# Patient Record
Sex: Female | Born: 1990 | Race: Black or African American | Hispanic: No | Marital: Single | Smoking: Never smoker
Health system: Southern US, Community
[De-identification: ages and names within clinical notes are randomized; demographics above are authoritative.]

## PROBLEM LIST (undated history)

## (undated) ENCOUNTER — Inpatient Hospital Stay (HOSPITAL_COMMUNITY): Payer: Self-pay

## (undated) DIAGNOSIS — D649 Anemia, unspecified: Secondary | ICD-10-CM

## (undated) DIAGNOSIS — K602 Anal fissure, unspecified: Secondary | ICD-10-CM

## (undated) DIAGNOSIS — F53 Postpartum depression: Secondary | ICD-10-CM

## (undated) DIAGNOSIS — N72 Inflammatory disease of cervix uteri: Secondary | ICD-10-CM

## (undated) DIAGNOSIS — L309 Dermatitis, unspecified: Secondary | ICD-10-CM

## (undated) DIAGNOSIS — Z86718 Personal history of other venous thrombosis and embolism: Secondary | ICD-10-CM

## (undated) DIAGNOSIS — Z87440 Personal history of urinary (tract) infections: Secondary | ICD-10-CM

## (undated) DIAGNOSIS — IMO0001 Reserved for inherently not codable concepts without codable children: Secondary | ICD-10-CM

## (undated) DIAGNOSIS — Z5189 Encounter for other specified aftercare: Secondary | ICD-10-CM

## (undated) DIAGNOSIS — O99345 Other mental disorders complicating the puerperium: Secondary | ICD-10-CM

## (undated) HISTORY — DX: Anal fissure, unspecified: K60.2

## (undated) HISTORY — DX: Personal history of urinary (tract) infections: Z87.440

## (undated) HISTORY — DX: Personal history of other venous thrombosis and embolism: Z86.718

## (undated) HISTORY — DX: Inflammatory disease of cervix uteri: N72

## (undated) HISTORY — PX: WISDOM TOOTH EXTRACTION: SHX21

## (undated) HISTORY — DX: Postpartum depression: F53.0

## (undated) HISTORY — DX: Other mental disorders complicating the puerperium: O99.345

---
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
## 2011-01-01 ENCOUNTER — Emergency Department (HOSPITAL_COMMUNITY)
Admission: EM | Admit: 2011-01-01 | Discharge: 2011-01-01 | Disposition: A | Discharge status: Home / Self Care | Payer: Medicaid Other | Attending: Emergency Medicine | Admitting: Emergency Medicine

## 2011-01-01 DIAGNOSIS — N6489 Other specified disorders of breast: Secondary | ICD-10-CM | POA: Insufficient documentation

## 2011-01-01 DIAGNOSIS — L299 Pruritus, unspecified: Secondary | ICD-10-CM | POA: Insufficient documentation

## 2011-01-31 LAB — CBC
HCT: 22.3 % — ABNORMAL LOW (ref 36.0–46.0)
Hemoglobin: 7.3 g/dL — ABNORMAL LOW (ref 12.0–15.0)
Hemoglobin: 7.7 g/dL — ABNORMAL LOW (ref 12.0–15.0)
MCH: 31 pg (ref 26.0–34.0)
MCH: 31.5 pg (ref 26.0–34.0)
MCHC: 34.5 g/dL (ref 30.0–36.0)
MCV: 89.8 fL (ref 78.0–100.0)
MCV: 90.7 fL (ref 78.0–100.0)
MCV: 91.2 fL (ref 78.0–100.0)
Platelets: 145 10*3/uL — ABNORMAL LOW (ref 150–400)
RBC: 2.36 MIL/uL — ABNORMAL LOW (ref 3.87–5.11)
RDW: 13.6 % (ref 11.5–15.5)
WBC: 11.4 10*3/uL — ABNORMAL HIGH (ref 4.0–10.5)
WBC: 7.9 10*3/uL (ref 4.0–10.5)

## 2011-01-31 LAB — CROSSMATCH
ABO/RH(D): A POS
Antibody Screen: NEGATIVE
Unit division: 0

## 2011-01-31 LAB — PREPARE FRESH FROZEN PLASMA: Unit division: 0

## 2011-01-31 LAB — FIBRINOGEN: Fibrinogen: 328 mg/dL (ref 204–475)

## 2011-01-31 LAB — MRSA PCR SCREENING: MRSA by PCR: NEGATIVE

## 2011-07-26 ENCOUNTER — Emergency Department (HOSPITAL_COMMUNITY)
Admission: EM | Admit: 2011-07-26 | Discharge: 2011-07-27 | Disposition: A | Discharge status: Home / Self Care | Payer: Medicaid Other | Attending: Emergency Medicine | Admitting: Emergency Medicine

## 2011-07-26 DIAGNOSIS — N898 Other specified noninflammatory disorders of vagina: Secondary | ICD-10-CM | POA: Insufficient documentation

## 2011-07-26 DIAGNOSIS — M545 Low back pain, unspecified: Secondary | ICD-10-CM | POA: Insufficient documentation

## 2011-07-26 DIAGNOSIS — R112 Nausea with vomiting, unspecified: Secondary | ICD-10-CM | POA: Insufficient documentation

## 2011-07-26 DIAGNOSIS — IMO0001 Reserved for inherently not codable concepts without codable children: Secondary | ICD-10-CM | POA: Insufficient documentation

## 2011-07-26 DIAGNOSIS — B9789 Other viral agents as the cause of diseases classified elsewhere: Secondary | ICD-10-CM | POA: Insufficient documentation

## 2011-07-26 DIAGNOSIS — R109 Unspecified abdominal pain: Secondary | ICD-10-CM | POA: Insufficient documentation

## 2011-07-26 DIAGNOSIS — R509 Fever, unspecified: Secondary | ICD-10-CM | POA: Insufficient documentation

## 2011-07-26 LAB — URINALYSIS, ROUTINE W REFLEX MICROSCOPIC
Bilirubin Urine: NEGATIVE
Glucose, UA: NEGATIVE mg/dL
Hgb urine dipstick: NEGATIVE
Specific Gravity, Urine: 1.02 (ref 1.005–1.030)
Urobilinogen, UA: 2 mg/dL — ABNORMAL HIGH (ref 0.0–1.0)

## 2011-07-27 LAB — BASIC METABOLIC PANEL
BUN: 8 mg/dL (ref 6–23)
CO2: 25 mEq/L (ref 19–32)
Calcium: 8.6 mg/dL (ref 8.4–10.5)
Chloride: 104 mEq/L (ref 96–112)
Creatinine, Ser: 0.67 mg/dL (ref 0.50–1.10)
Glucose, Bld: 86 mg/dL (ref 70–99)

## 2011-07-27 LAB — DIFFERENTIAL
Eosinophils Absolute: 0 10*3/uL (ref 0.0–0.7)
Lymphocytes Relative: 9 % — ABNORMAL LOW (ref 12–46)
Lymphs Abs: 1 10*3/uL (ref 0.7–4.0)
Monocytes Relative: 8 % (ref 3–12)
Neutro Abs: 8.9 10*3/uL — ABNORMAL HIGH (ref 1.7–7.7)
Neutrophils Relative %: 83 % — ABNORMAL HIGH (ref 43–77)

## 2011-07-27 LAB — CBC
HCT: 34.5 % — ABNORMAL LOW (ref 36.0–46.0)
Hemoglobin: 12.3 g/dL (ref 12.0–15.0)
MCH: 30.6 pg (ref 26.0–34.0)
MCV: 85.8 fL (ref 78.0–100.0)
Platelets: 187 10*3/uL (ref 150–400)
RBC: 4.02 MIL/uL (ref 3.87–5.11)
WBC: 10.6 10*3/uL — ABNORMAL HIGH (ref 4.0–10.5)

## 2011-07-27 LAB — WET PREP, GENITAL: Clue Cells Wet Prep HPF POC: NONE SEEN

## 2011-07-27 LAB — RAPID STREP SCREEN (MED CTR MEBANE ONLY): Streptococcus, Group A Screen (Direct): NEGATIVE

## 2011-07-27 LAB — POCT PREGNANCY, URINE: Preg Test, Ur: NEGATIVE

## 2011-07-27 LAB — GC/CHLAMYDIA PROBE AMP, GENITAL: GC Probe Amp, Genital: NEGATIVE

## 2011-12-11 ENCOUNTER — Encounter (HOSPITAL_COMMUNITY): Payer: Self-pay | Admitting: Emergency Medicine

## 2011-12-11 ENCOUNTER — Emergency Department (HOSPITAL_COMMUNITY)
Admission: EM | Admit: 2011-12-11 | Discharge: 2011-12-11 | Disposition: A | Discharge status: Home / Self Care | Payer: Medicaid Other | Attending: Emergency Medicine | Admitting: Emergency Medicine

## 2011-12-11 DIAGNOSIS — R350 Frequency of micturition: Secondary | ICD-10-CM | POA: Insufficient documentation

## 2011-12-11 DIAGNOSIS — R300 Dysuria: Secondary | ICD-10-CM | POA: Insufficient documentation

## 2011-12-11 DIAGNOSIS — N898 Other specified noninflammatory disorders of vagina: Secondary | ICD-10-CM | POA: Insufficient documentation

## 2011-12-11 HISTORY — DX: Reserved for inherently not codable concepts without codable children: IMO0001

## 2011-12-11 HISTORY — DX: Encounter for other specified aftercare: Z51.89

## 2011-12-11 LAB — PREGNANCY, URINE: Preg Test, Ur: NEGATIVE

## 2011-12-11 LAB — URINALYSIS, ROUTINE W REFLEX MICROSCOPIC
Hgb urine dipstick: NEGATIVE
Leukocytes, UA: NEGATIVE
Protein, ur: NEGATIVE mg/dL
Specific Gravity, Urine: 1.028 (ref 1.005–1.030)
Urobilinogen, UA: 1 mg/dL (ref 0.0–1.0)

## 2011-12-11 NOTE — ED Notes (Signed)
Discharge instructions reviewed with pt; verbalizes understanding.  Pt refused pelvic exam at this time stating she will follow up at local health department.  Pt ambulatory to lobby.  NAD noted.

## 2011-12-11 NOTE — ED Provider Notes (Signed)
History     CSN: [id number]  Arrival date & time 12/11/11  4098   First MD Initiated Contact with Patient 12/11/11 2102      Chief Complaint  Patient presents with  . Vaginal Discharge    HPI  History provided by the patient. Patient is a 21 year old female with no significant past medical history who presents with complaints of vaginal discharge for the past 3-4 days. Discharge is a clear with no significant strong odor. She denies any vaginal bleeding. Patient reports having unprotected sex last month. Patient is currently not taking any form of birth control. She also reports being late for her menstrual period this month. She denies any abdominal pains. She does report slight dysuria and urinary frequency. Patient has not taken anything for her symptoms. Patient does report taking a home pregnancy test 2 weeks ago that was negative at that time. Patient has no other symptoms or complaints.     Past Medical History  Diagnosis Date  . Blood transfusion     History reviewed. No pertinent past surgical history.  No family history on file.  History  Substance Use Topics  . Smoking status: Current Some Day Smoker  . Smokeless tobacco: Not on file  . Alcohol Use: No    OB History    Grav Para Term Preterm Abortions TAB SAB Ect Mult Living                  Review of Systems  Constitutional: Negative for fever and chills.  Gastrointestinal: Negative for nausea, vomiting, abdominal pain, diarrhea, constipation and blood in stool.  Genitourinary: Positive for dysuria, frequency and vaginal discharge. Negative for vaginal bleeding and pelvic pain.  All other systems reviewed and are negative.    Allergies  Review of patient's allergies indicates no known allergies.  Home Medications  No current outpatient prescriptions on file.  BP 119/64  Pulse 82  Temp(Src) 97.6 F (36.4 C) (Oral)  Resp 16  SpO2 100%  LMP 11/10/2011  Physical Exam  Nursing note and vitals  reviewed. Constitutional: She is oriented to person, place, and time. She appears well-developed and well-nourished. No distress.  HENT:  Head: Normocephalic and atraumatic.  Cardiovascular: Normal rate and regular rhythm.   Pulmonary/Chest: Effort normal and breath sounds normal. No respiratory distress.  Abdominal: Soft. Bowel sounds are normal. She exhibits no distension. There is no tenderness. There is no rebound and no guarding.  Genitourinary:       Patient has refused exam  Neurological: She is alert and oriented to person, place, and time.  Skin: Skin is warm and dry. No rash noted.  Psychiatric: She has a normal mood and affect. Her behavior is normal.    ED Course  Procedures    Labs Reviewed  URINALYSIS, ROUTINE W REFLEX MICROSCOPIC  PREGNANCY, URINE  POCT PREGNANCY, URINE    Results for orders placed during the hospital encounter of 12/11/11  URINALYSIS, ROUTINE W REFLEX MICROSCOPIC      Component Value Range   Color, Urine YELLOW  YELLOW    APPearance CLEAR  CLEAR    Specific Gravity, Urine 1.028  1.005 - 1.030    pH 6.0  5.0 - 8.0    Glucose, UA NEGATIVE  NEGATIVE (mg/dL)   Hgb urine dipstick NEGATIVE  NEGATIVE    Bilirubin Urine NEGATIVE  NEGATIVE    Ketones, ur NEGATIVE  NEGATIVE (mg/dL)   Protein, ur NEGATIVE  NEGATIVE (mg/dL)   Urobilinogen, UA 1.0  0.0 - 1.0 (mg/dL)   Nitrite NEGATIVE  NEGATIVE    Leukocytes, UA NEGATIVE  NEGATIVE   PREGNANCY, URINE      Component Value Range   Preg Test, Ur NEGATIVE        1. Vaginal discharge       MDM  9:00 PM patient seen and evaluated. Patient in no acute distress.  9:30 PM  I prepared to perform pelvic exam after moving patient to a private room. At this time patient has refused to have the exam. Patient states that she would rather followup with her own OB/GYN or the [facility] health Department for evaluation rather than having pelvic exam performed here in the emergency room. I explained to  patient possible complications of sexual transmitted disease should her symptoms be caused from this. This included worsening infection, infection is spreading to the uterus, fallopian tubes or ovaries. I also explained that such an infection spreading to results in consequences of scarring and difficulty with fertility in the future. I also explained this could cause a severe infection leading to illness. Patient expressed her understanding and still wished to followup tomorrow elsewhere. I informed her she was free to return to the emergency room at any time should she change her mind. I also strongly encourage her to return to emergency room should she develop symptoms of fever, chills, sweats, increased discharge or increased pain.       Angus Seller, [place] 12/11/11 2230

## 2011-12-11 NOTE — ED Notes (Signed)
Pt c/o vag. Discharge x's 3 days.  St's she had unprotected sex in Dec.

## 2011-12-12 NOTE — ED Provider Notes (Signed)
Medical screening examination/treatment/procedure(s) were performed by non-physician practitioner and as supervising physician I was immediately available for consultation/collaboration.  Flint Melter, MD 12/12/11 1323

## 2011-12-30 ENCOUNTER — Encounter (HOSPITAL_COMMUNITY): Payer: Self-pay

## 2011-12-30 ENCOUNTER — Emergency Department (HOSPITAL_COMMUNITY)
Admission: EM | Admit: 2011-12-30 | Discharge: 2011-12-30 | Disposition: A | Discharge status: Home / Self Care | Payer: Self-pay | Source: Home / Self Care | Attending: Family Medicine | Admitting: Family Medicine

## 2011-12-30 DIAGNOSIS — J069 Acute upper respiratory infection, unspecified: Secondary | ICD-10-CM

## 2011-12-30 NOTE — ED Notes (Signed)
Pt has head congestion and headache.

## 2011-12-30 NOTE — ED Provider Notes (Signed)
History     CSN: [id number]  Arrival date & time 12/30/11  1156   First MD Initiated Contact with Patient 12/30/11 1302      Chief Complaint  Patient presents with  . URI    (Consider location/radiation/quality/duration/timing/severity/associated sxs/prior treatment) Patient is a 21 y.o. female presenting with URI. The history is provided by the patient.  URI The primary symptoms include ear pain, sore throat, cough and myalgias. Primary symptoms do not include fever, wheezing, abdominal pain, nausea, vomiting or rash. The current episode started yesterday. This is a new problem. The problem has not changed since onset. The onset of the illness is associated with exposure to sick contacts. Symptoms associated with the illness include congestion and rhinorrhea.    Past Medical History  Diagnosis Date  . Blood transfusion     History reviewed. No pertinent past surgical history.  History reviewed. No pertinent family history.  History  Substance Use Topics  . Smoking status: Current Some Day Smoker  . Smokeless tobacco: Not on file  . Alcohol Use: No    OB History    Grav Para Term Preterm Abortions TAB SAB Ect Mult Living                  Review of Systems  Constitutional: Negative for fever.  HENT: Positive for ear pain, congestion, sore throat and rhinorrhea.   Respiratory: Positive for cough. Negative for wheezing.   Gastrointestinal: Negative.  Negative for nausea, vomiting and abdominal pain.  Genitourinary: Negative.   Musculoskeletal: Positive for myalgias.  Skin: Negative for rash.    Allergies  Review of patient's allergies indicates no known allergies.  Home Medications  No current outpatient prescriptions on file.  BP 115/68  Pulse 69  Temp(Src) 98.6 F (37 C) (Oral)  Resp 18  SpO2 100%  LMP 12/12/2011  Physical Exam  Nursing note and vitals reviewed. Constitutional: She is oriented to person, place, and time. She appears well-developed.    HENT:  Head: Normocephalic.  Right Ear: External ear normal.  Left Ear: External ear normal.  Nose: Mucosal edema and rhinorrhea present.  Mouth/Throat: Oropharynx is clear and moist.  Eyes: Conjunctivae are normal. Pupils are equal, round, and reactive to light.  Neck: Normal range of motion. Neck supple.  Cardiovascular: Normal rate, normal heart sounds and intact distal pulses.   Pulmonary/Chest: Effort normal and breath sounds normal.  Abdominal: Soft. Bowel sounds are normal.  Lymphadenopathy:    She has no cervical adenopathy.  Neurological: She is alert and oriented to person, place, and time.  Skin: Skin is warm and dry.  Psychiatric: She has a normal mood and affect.    ED Course  Procedures (including critical care time)  Labs Reviewed - No data to display No results found.   1. URI (upper respiratory infection)       MDM          Barkley Bruns, MD 12/30/11 1335

## 2012-01-31 ENCOUNTER — Emergency Department (HOSPITAL_COMMUNITY)
Admission: EM | Admit: 2012-01-31 | Discharge: 2012-01-31 | Discharge status: Against Medical Advice | Payer: Medicaid Other | Attending: Emergency Medicine | Admitting: Emergency Medicine

## 2012-01-31 ENCOUNTER — Encounter (HOSPITAL_COMMUNITY): Payer: Self-pay | Admitting: Emergency Medicine

## 2012-01-31 DIAGNOSIS — A059 Bacterial foodborne intoxication, unspecified: Secondary | ICD-10-CM | POA: Insufficient documentation

## 2012-01-31 NOTE — ED Notes (Signed)
Pt states she feels like she has food poisoning  Symptoms started yesterday and include vomiting, dizziness, and feeling weak all over  Pt states she is having loose stools but not really diarrhea  Pt is also c/o abd cramping

## 2012-02-08 ENCOUNTER — Emergency Department (INDEPENDENT_AMBULATORY_CARE_PROVIDER_SITE_OTHER)
Admission: EM | Admit: 2012-02-08 | Discharge: 2012-02-08 | Disposition: A | Discharge status: Home / Self Care | Payer: Medicaid Other | Source: Home / Self Care | Attending: Family Medicine | Admitting: Family Medicine

## 2012-02-08 ENCOUNTER — Encounter (HOSPITAL_COMMUNITY): Payer: Self-pay | Admitting: *Deleted

## 2012-02-08 DIAGNOSIS — A499 Bacterial infection, unspecified: Secondary | ICD-10-CM

## 2012-02-08 DIAGNOSIS — N760 Acute vaginitis: Secondary | ICD-10-CM

## 2012-02-08 LAB — POCT URINALYSIS DIP (DEVICE)
Glucose, UA: NEGATIVE mg/dL
Nitrite: NEGATIVE
Protein, ur: NEGATIVE mg/dL
Specific Gravity, Urine: 1.025 (ref 1.005–1.030)
Urobilinogen, UA: 0.2 mg/dL (ref 0.0–1.0)

## 2012-02-08 LAB — POCT PREGNANCY, URINE: Preg Test, Ur: NEGATIVE

## 2012-02-08 LAB — WET PREP, GENITAL: Trich, Wet Prep: NONE SEEN

## 2012-02-08 MED ORDER — FLUCONAZOLE 150 MG PO TABS: 150.0000 mg | ORAL_TABLET | Freq: Once | ORAL | Status: AC

## 2012-02-08 MED ORDER — METRONIDAZOLE 500 MG PO TABS: 500.0000 mg | ORAL_TABLET | Freq: Two times a day (BID) | ORAL | Status: AC

## 2012-02-08 NOTE — Discharge Instructions (Signed)
Based on your examination, the discharge seems consistent with bacterial vaginosis, a common vaginal infection that is not sexually transmitted, though sex can be a contributing factor where the acid-base status of the vagina is changed. Take the antibiotics as directed. Do not consume alcohol while taking this medication. You should also use condoms while taking this medication. I am also writing a rx for Diflucan, a yeast medication. You do not need to take this medication unless you experience symptoms of a yeast infection, such as redness, itching, thick white discharge, etc. If you have any concerns, return to care for re-evaluation.

## 2012-02-08 NOTE — ED Notes (Signed)
Pt  Reports  Symptoms  Of  Vaginal  Discharge         -  Vaginal irritation         X  4  Days  -  Pt    Appears in no  Severe  Distress  Sitting  Upright on   Exam  Table     -  She  Ambulated  To  Exam room with a  Steady  Fluid  Gait

## 2012-02-08 NOTE — ED Provider Notes (Signed)
History     CSN: [id number]  Arrival date & time 02/08/12  7846   First MD Initiated Contact with Patient 02/08/12 1903      Chief Complaint  Patient presents with  . Vaginal Discharge    (Consider location/radiation/quality/duration/timing/severity/associated sxs/prior treatment) HPI Comments: Stacey Mann presents for evaluation of vaginal discharge, burning, itching. She denies any dysuria. She has had both bacterial infections and yeast infections. She had sexual intercourse last week with her monogamous partner. They did use condoms.  Patient is a 21 y.o. female presenting with vaginal discharge. The history is provided by the patient.  Vaginal Discharge This is a new problem. The current episode started more than 2 days ago. The problem occurs constantly.    Past Medical History  Diagnosis Date  . Blood transfusion     History reviewed. No pertinent past surgical history.  History reviewed. No pertinent family history.  History  Substance Use Topics  . Smoking status: Former Games developer  . Smokeless tobacco: Not on file  . Alcohol Use: Yes     rare    OB History    Grav Para Term Preterm Abortions TAB SAB Ect Mult Living                  Review of Systems  Constitutional: Negative.   HENT: Negative.   Eyes: Negative.   Respiratory: Negative.   Cardiovascular: Negative.   Gastrointestinal: Negative.   Genitourinary: Positive for vaginal discharge. Negative for dysuria, frequency and genital sores.  Musculoskeletal: Negative.   Skin: Negative.   Neurological: Negative.     Allergies  Review of patient's allergies indicates no known allergies.  Home Medications   Current Outpatient Rx  Name Route Sig Dispense Refill  . FLUCONAZOLE 150 MG PO TABS Oral Take 1 tablet (150 mg total) by mouth once. Take one pill once. May repeat if symptoms persist after 3rd day. 2 tablet 0  . METRONIDAZOLE 500 MG PO TABS Oral Take 1 tablet (500 mg total) by mouth 2 (two) times  daily. 14 tablet 0    BP 110/67  Pulse 68  Temp(Src) 98.2 F (36.8 C) (Oral)  Resp 14  SpO2 100%  LMP 01/02/2012  Physical Exam  Nursing note and vitals reviewed. Constitutional: She is oriented to person, place, and time. She appears well-developed and well-nourished.  HENT:  Head: Normocephalic and atraumatic.  Eyes: EOM are normal.  Neck: Normal range of motion.  Pulmonary/Chest: Effort normal.  Genitourinary: Cervix exhibits no discharge. Vaginal discharge found.  Musculoskeletal: Normal range of motion.  Neurological: She is alert and oriented to person, place, and time.  Skin: Skin is warm and dry.  Psychiatric: Her behavior is normal.    ED Course  Procedures (including critical care time)  Labs Reviewed  POCT URINALYSIS DIP (DEVICE) - Abnormal; Notable for the following:    Hgb urine dipstick TRACE (*)    Leukocytes, UA TRACE (*) Biochemical Testing Only. Please order routine urinalysis from main lab if confirmatory testing is needed.   All other components within normal limits  POCT PREGNANCY, URINE  GC/CHLAMYDIA PROBE AMP, GENITAL  WET PREP, GENITAL   No results found.   1. Vaginitis   2. Bacterial vaginosis       MDM  Treating empirically for BV, with diflucan back-up        Renaee Munda, MD 02/08/12 2018

## 2012-02-09 LAB — GC/CHLAMYDIA PROBE AMP, GENITAL: GC Probe Amp, Genital: NEGATIVE

## 2012-02-09 NOTE — ED Notes (Signed)
GC/Chlamydia neg., Wet prep: Few yeast, mod. WBC's. Pt. adequately treated with Diflucan. Vassie Moselle 02/09/2012

## 2012-05-06 ENCOUNTER — Emergency Department (INDEPENDENT_AMBULATORY_CARE_PROVIDER_SITE_OTHER)
Admission: EM | Admit: 2012-05-06 | Discharge: 2012-05-06 | Disposition: A | Discharge status: Home Health | Payer: Medicaid Other | Source: Home / Self Care | Attending: Family Medicine | Admitting: Family Medicine

## 2012-05-06 ENCOUNTER — Encounter (HOSPITAL_COMMUNITY): Payer: Self-pay | Admitting: *Deleted

## 2012-05-06 DIAGNOSIS — K602 Anal fissure, unspecified: Secondary | ICD-10-CM

## 2012-05-06 DIAGNOSIS — N760 Acute vaginitis: Secondary | ICD-10-CM

## 2012-05-06 LAB — WET PREP, GENITAL
Clue Cells Wet Prep HPF POC: NONE SEEN
Trich, Wet Prep: NONE SEEN
Yeast Wet Prep HPF POC: NONE SEEN

## 2012-05-06 MED ORDER — FLUCONAZOLE 150 MG PO TABS: ORAL_TABLET | ORAL | Status: DC

## 2012-05-06 MED ORDER — METRONIDAZOLE 500 MG PO TABS: 500.0000 mg | ORAL_TABLET | Freq: Two times a day (BID) | ORAL | Status: AC

## 2012-05-06 MED ORDER — HYDROCORTISONE ACETATE 25 MG RE SUPP: 25.0000 mg | Freq: Two times a day (BID) | RECTAL | Status: AC | PRN

## 2012-05-06 NOTE — Discharge Instructions (Signed)
We will treat you empirically (without knowing test results) for bacterial vaginosis and yeast. We will call you if any of the pending tests results come abnormal. We will also call in a medication to your pharmacy or will ask you to return here to be treated if an abnormal test result for what you have not been treated today.   Take the prescribed medications as instructed. Return or followup with your GYN if persistent or worsening symptoms despite following treatment. Follow handout recommendations for supportive care and to avoid recurrence of anal fissure.

## 2012-05-06 NOTE — ED Provider Notes (Signed)
History     CSN: [id number]  Arrival date & time 05/06/12  1724   First MD Initiated Contact with Patient 05/06/12 1821      Chief Complaint  Patient presents with  . Anal Itching    (Consider location/radiation/quality/duration/timing/severity/associated sxs/prior treatment) HPI Comments: 21 year old female with no significant past medical history. Here complaining of: 1) rectal itchiness for about 2 weeks. Denies blood per rectum denies constipation or rectal pain. Although reports has had some burning associated with itchiness in her rectal area. 2) vaginal discharge and vaginal itchiness for over 2 weeks. Has had these symptoms intermittently in the past was treated for bacterial vaginosis and tested negative for gonorrhea and Chlamydia in January and also in March 2013. Denies history of sexually transmitted diseases but she is anxious about her partner "skeeping outside their relationship twice during the last 3 years" they've been together. She denies any other sexual contact other than her partner who is also the father of her child. The couple does not use condoms. Denies pelvic pain. Fever or chills. Denies dysuria or hematuria.   Past Medical History  Diagnosis Date  . Blood transfusion     History reviewed. No pertinent past surgical history.  Family History  Problem Relation Age of Onset  . Family history unknown: Yes    History  Substance Use Topics  . Smoking status: Former Games developer  . Smokeless tobacco: Not on file  . Alcohol Use: Yes     rare    OB History    Grav Para Term Preterm Abortions TAB SAB Ect Mult Living                  Review of Systems  Constitutional: Negative for fever, chills, activity change, appetite change and fatigue.       10 systems reviewed and  pertinent negative and positive symptoms are as per HPI.     Gastrointestinal: Negative for nausea, vomiting, abdominal pain, diarrhea and anal bleeding.  Genitourinary: Positive for  vaginal discharge. Negative for dysuria, frequency, hematuria, flank pain, vaginal bleeding, genital sores, menstrual problem and pelvic pain.    Allergies  Review of patient's allergies indicates no known allergies.  Home Medications   Current Outpatient Rx  Name Route Sig Dispense Refill  . FLUCONAZOLE 150 MG PO TABS  1 tab po every 72 h x 3 doses total 3 tablet 0  . HYDROCORTISONE ACETATE 25 MG RE SUPP Rectal Place 1 suppository (25 mg total) rectally 2 (two) times daily as needed for hemorrhoids. 12 suppository 0  . METRONIDAZOLE 500 MG PO TABS Oral Take 1 tablet (500 mg total) by mouth 2 (two) times daily. 14 tablet 0    BP 109/68  Pulse 64  Temp 98.6 F (37 C) (Oral)  Resp 16  SpO2 100%  LMP 04/11/2012  Physical Exam  Nursing note and vitals reviewed. Constitutional: She is oriented to person, place, and time. She appears well-developed and well-nourished. No distress.  HENT:  Head: Normocephalic and atraumatic.  Eyes: Conjunctivae are normal.  Cardiovascular: Normal heart sounds.   Pulmonary/Chest: Breath sounds normal.  Abdominal: Soft. Bowel sounds are normal. She exhibits no distension and no mass. There is no tenderness. There is no rebound and no guarding.  Genitourinary: Uterus normal. Rectal exam shows fissure. Rectal exam shows no external hemorrhoid, no internal hemorrhoid, no mass, no tenderness and anal tone normal. There is no rash or injury on the right labia. There is no rash or injury on  the left labia. Cervix exhibits no motion tenderness. Right adnexum displays no mass and no tenderness. Left adnexum displays no mass and no tenderness. There is erythema around the vagina. Vaginal discharge found.  Lymphadenopathy:    She has no cervical adenopathy.  Neurological: She is alert and oriented to person, place, and time.  Skin: No rash noted.    ED Course  Procedures (including critical care time)   Labs Reviewed  WET PREP, GENITAL  GC/CHLAMYDIA PROBE  AMP, GENITAL   No results found.   1. Anal fissure   2. Vaginitis and vulvovaginitis       MDM  On exam small anal fissure in healing stage.  Also findings consistent with vulvovaginitis likely Candida related. Treated empirically with Diflucan 150 mg every 72 hours per 3 doses. Also prescribed Flagyl 500 mg twice a day for 7 days. At the time of discharge wet prep, GC and Chlamydia test pending. Hydrocortisone rectal suppository prescribed for rectal pruritus. Followup with GYN if persistent symptoms despite following treatment.  Sharin Grave, MD 05/07/12 1539

## 2012-05-06 NOTE — ED Notes (Signed)
PT REPORTS ANAL ITCHING WITH NO REPORT OF BLOODY STOOL OR CONSTIPATION OR PAIN.

## 2012-05-07 LAB — GC/CHLAMYDIA PROBE AMP, GENITAL
Chlamydia, DNA Probe: NEGATIVE
GC Probe Amp, Genital: NEGATIVE

## 2012-07-25 ENCOUNTER — Encounter (HOSPITAL_COMMUNITY): Payer: Self-pay

## 2012-07-25 ENCOUNTER — Emergency Department (INDEPENDENT_AMBULATORY_CARE_PROVIDER_SITE_OTHER)
Admission: EM | Admit: 2012-07-25 | Discharge: 2012-07-25 | Disposition: A | Discharge status: Home / Self Care | Payer: Medicaid Other | Source: Home / Self Care | Attending: Emergency Medicine | Admitting: Emergency Medicine

## 2012-07-25 DIAGNOSIS — N72 Inflammatory disease of cervix uteri: Secondary | ICD-10-CM

## 2012-07-25 DIAGNOSIS — K602 Anal fissure, unspecified: Secondary | ICD-10-CM

## 2012-07-25 LAB — POCT URINALYSIS DIP (DEVICE)
Leukocytes, UA: NEGATIVE
Nitrite: NEGATIVE
Protein, ur: NEGATIVE mg/dL
Urobilinogen, UA: 1 mg/dL (ref 0.0–1.0)
pH: 7 (ref 5.0–8.0)

## 2012-07-25 LAB — POCT PREGNANCY, URINE: Preg Test, Ur: NEGATIVE

## 2012-07-25 LAB — WET PREP, GENITAL
Trich, Wet Prep: NONE SEEN
Yeast Wet Prep HPF POC: NONE SEEN

## 2012-07-25 LAB — OCCULT BLOOD, POC DEVICE: Fecal Occult Bld: NEGATIVE

## 2012-07-25 MED ORDER — DILTIAZEM GEL 2 %: 1.0000 "application " | Freq: Two times a day (BID) | CUTANEOUS | Status: DC

## 2012-07-25 MED ORDER — DOCUSATE SODIUM 100 MG PO CAPS: 100.0000 mg | ORAL_CAPSULE | Freq: Two times a day (BID) | ORAL | Status: AC

## 2012-07-25 MED ORDER — DOXYCYCLINE HYCLATE 100 MG PO TABS: 100.0000 mg | ORAL_TABLET | Freq: Two times a day (BID) | ORAL | Status: AC

## 2012-07-25 NOTE — ED Notes (Signed)
C/o rectal bleeding with BMs for 1 week.  States she has had a fissure before.  Reports stools have been hard and she thinks she may have another fissure.  Also reports vaginal spotting after intercourse today.

## 2012-07-25 NOTE — ED Provider Notes (Signed)
Chief Complaint  Patient presents with  . Dysuria  . Rectal Bleeding    History of Present Illness:   The patient is a 21 year old female who comes in today with 3 separate issues: Hematochezia, bladder pressure, and bleeding after intercourse.  The last month she's had occasional bright red blood per rectum. She was seen here and diagnosed with an anal fissure. She was told to use creams and take a laxative. She has not tried any laxatives. She still tends to be constipated. Stools are hard and bulky and she has to strain to pass her stools. She continues to have bright red blood occasionally on the stool but mostly on the toilet paper. She denies any rectal pain or itching.  She also has had a two-day history of bladder pressure and frequency. She denies any urgency, dysuria, or blood in the urine. She did have urinary tract infection years ago.  She also has had a one-day history of bleeding after intercourse. This was just a few spots of blood. She also has had some discharge and some odor. She has a history of yeast infection and atrial vaginosis. She denies any pelvic pain, fevers, chills, nausea, or vomiting. Her last menstrual period was August 22.  Review of Systems:  Other than noted above, the patient denies any of the following symptoms: Systemic:  No fever, chills, sweats, fatigue, or weight loss. GI:  No abdominal pain, nausea, anorexia, vomiting, diarrhea, constipation, melena or hematochezia. GU:  No dysuria, frequency, urgency, hematuria, vaginal discharge, itching, or abnormal vaginal bleeding. Skin:  No rash or itching.  PMFSH:  Past medical history, family history, social history, meds, and allergies were reviewed.  Physical Exam:   Vital signs:  BP 111/65  Pulse 72  Temp 98.1 F (36.7 C) (Oral)  Resp 14  SpO2 99%  LMP 07/11/2012 General:  Alert, oriented and in no distress. Lungs:  Breath sounds clear and equal bilaterally.  No wheezes, rales or rhonchi. Heart:   Regular rhythm.  No gallops or murmers. Abdomen:  Soft, flat and non-distended.  No organomegaly or mass.  No tenderness, guarding or rebound.  Bowel sounds normally active. Pelvic exam:  Normal external genitalia. Vaginal mucosa was normal. She did have a mucoid discharge coming from her cervical os. No cervical motion tenderness. Uterus was midposition, normal in size and shape and nontender. No adnexal masses or tenderness. Rectal exam: No bleeding noted externally. Digital exam reveals no masses and heme-negative stool. Skin:  Clear, warm and dry.  Labs:   Results for orders placed during the hospital encounter of 07/25/12  POCT URINALYSIS DIP (DEVICE)      Component Value Range   Glucose, UA NEGATIVE  NEGATIVE mg/dL   Bilirubin Urine NEGATIVE  NEGATIVE   Ketones, ur NEGATIVE  NEGATIVE mg/dL   Specific Gravity, Urine 1.020  1.005 - 1.030   Hgb urine dipstick NEGATIVE  NEGATIVE   pH 7.0  5.0 - 8.0   Protein, ur NEGATIVE  NEGATIVE mg/dL   Urobilinogen, UA 1.0  0.0 - 1.0 mg/dL   Nitrite NEGATIVE  NEGATIVE   Leukocytes, UA NEGATIVE  NEGATIVE  POCT PREGNANCY, URINE      Component Value Range   Preg Test, Ur NEGATIVE  NEGATIVE  OCCULT BLOOD, POC DEVICE      Component Value Range   Fecal Occult Bld NEGATIVE    WET PREP, GENITAL      Component Value Range   Yeast Wet Prep HPF POC NONE SEEN  NONE SEEN   Trich, Wet Prep NONE SEEN  NONE SEEN   Clue Cells Wet Prep HPF POC FEW (*) NONE SEEN   WBC, Wet Prep HPF POC FEW (*) NONE SEEN     Assessment:  The primary encounter diagnosis was Cervicitis. A diagnosis of Anal fissure was also pertinent to this visit.  Plan:   1.  The following meds were prescribed:   New Prescriptions   DILTIAZEM 2 % GEL    Apply 1 application topically 2 (two) times daily.   DOCUSATE SODIUM (COLACE) 100 MG CAPSULE    Take 1 capsule (100 mg total) by mouth 2 (two) times daily.   DOXYCYCLINE (VIBRA-TABS) 100 MG TABLET    Take 1 tablet (100 mg total) by mouth 2  (two) times daily.   2.  The patient was instructed in symptomatic care and handouts were given. 3.  The patient was told to return if becoming worse in any way, if no better in 3 or 4 days, and given some red flag symptoms that would indicate earlier return.  Follow up:  The patient was told to follow up with Dr. Lenice Llamas in 2 weeks.     Reuben Likes, MD 07/25/12 2219

## 2012-07-26 LAB — URINE CULTURE

## 2012-07-26 LAB — GC/CHLAMYDIA PROBE AMP, GENITAL: GC Probe Amp, Genital: NEGATIVE

## 2012-07-29 ENCOUNTER — Telehealth (HOSPITAL_COMMUNITY): Payer: Self-pay | Admitting: *Deleted

## 2012-07-29 NOTE — ED Notes (Signed)
Pt. called for her lab results. Pt. verified x 2 and given results.  (GC/Chlamydia neg., few clue cells, few WBC's, Urine culture: no growth)  Pt. told we only treat clue cells if it is mod. or many, so everything is negative. Pt. Asked if she shoudl  f/u with her GYN if symptoms continue. I told her yes.  Pt. voiced understanding.

## 2012-09-03 ENCOUNTER — Encounter: Payer: Self-pay | Admitting: Obstetrics and Gynecology

## 2012-09-03 ENCOUNTER — Ambulatory Visit (INDEPENDENT_AMBULATORY_CARE_PROVIDER_SITE_OTHER): Payer: Medicaid Other | Admitting: Obstetrics and Gynecology

## 2012-09-03 VITALS — BP 100/56 | Ht 69.0 in | Wt 138.0 lb

## 2012-09-03 DIAGNOSIS — B373 Candidiasis of vulva and vagina: Secondary | ICD-10-CM

## 2012-09-03 MED ORDER — CLOTRIMAZOLE 1 % VA CREA: 1.0000 | TOPICAL_CREAM | Freq: Two times a day (BID) | VAGINAL | Status: DC

## 2012-09-03 MED ORDER — FLUCONAZOLE 150 MG PO TABS: 150.0000 mg | ORAL_TABLET | Freq: Once | ORAL | Status: DC

## 2012-09-03 NOTE — Patient Instructions (Signed)
Monilial Vaginitis Vaginitis in a soreness, swelling and redness (inflammation) of the vagina and vulva. Monilial vaginitis is not a sexually transmitted infection. CAUSES  Yeast vaginitis is caused by yeast (candida) that is normally found in your vagina. With a yeast infection, the candida has overgrown in number to a point that upsets the chemical balance. SYMPTOMS   White, thick vaginal discharge.  Swelling, itching, redness and irritation of the vagina and possibly the lips of the vagina (vulva).  Burning or painful urination.  Painful intercourse. DIAGNOSIS  Things that may contribute to monilial vaginitis are:  Postmenopausal and virginal states.  Pregnancy.  Infections.  Being tired, sick or stressed, especially if you had monilial vaginitis in the past.  Diabetes. Good control will help lower the chance.  Birth control pills.  Tight fitting garments.  Using bubble bath, feminine sprays, douches or deodorant tampons.  Taking certain medications that kill germs (antibiotics).  Sporadic recurrence can occur if you become ill. TREATMENT  Your caregiver will give you medication.  There are several kinds of anti monilial vaginal creams and suppositories specific for monilial vaginitis. For recurrent yeast infections, use a suppository or cream in the vagina 2 times a week, or as directed.  Anti-monilial or steroid cream for the itching or irritation of the vulva may also be used. Get your caregiver's permission.  Painting the vagina with methylene blue solution may help if the monilial cream does not work.  Eating yogurt may help prevent monilial vaginitis. HOME CARE INSTRUCTIONS   Finish all medication as prescribed.  Do not have sex until treatment is completed or after your caregiver tells you it is okay.  Take warm sitz baths.  Do not douche.  Do not use tampons, especially scented ones.  Wear cotton underwear.  Avoid tight pants and panty  hose.  Tell your sexual partner that you have a yeast infection. They should go to their caregiver if they have symptoms such as mild rash or itching.  Your sexual partner should be treated as well if your infection is difficult to eliminate.  Practice safer sex. Use condoms.  Some vaginal medications cause latex condoms to fail. Vaginal medications that harm condoms are:  Cleocin cream.  Butoconazole (Femstat).  Terconazole (Terazol) vaginal suppository.  Miconazole (Monistat) (may be purchased over the counter). SEEK MEDICAL CARE IF:   You have a temperature by mouth above 102 F (38.9 C).  The infection is getting worse after 2 days of treatment.  The infection is not getting better after 3 days of treatment.  You develop blisters in or around your vagina.  You develop vaginal bleeding, and it is not your menstrual period.  You have pain when you urinate.  You develop intestinal problems.  You have pain with sexual intercourse. Document Released: 08/16/2005 Document Revised: 01/29/2012 Document Reviewed: 04/30/2009 Exit[employer] Patient Information 2013 [employer].  

## 2012-09-03 NOTE — Progress Notes (Signed)
Pt is here for hospital f/u for cervicitis. Pt seen in the ER.  gc and chlam both negative.  She was given doxycycline and now has a clumpy white discharge with itching. BP 100/56  Ht 5\' 9"  (1.753 m)  Wt 138 lb (62.596 kg)  BMI 20.38 kg/m2  LMP 08/11/2012 Physical Examination: General appearance - alert, well appearing, and in no distress Heart - normal rate and regular rhythm Abdomen - soft, nontender, nondistended, no masses or organomegaly Pelvic - normal external genitalia, vulva, vagina, cervix, uterus and adnexa, VULVA: normal appearing vulva with no masses, tenderness or lesions, VAGINA: vaginal discharge - white, curd-like, odorless and thick, CERVIX: normal appearing cervix without discharge or lesions, UTERUS: uterus is normal size, shape, consistency and nontender, ADNEXA: normal adnexa in size, nontender and no masses, WET MOUNT done - results: hyphae Yeast vaginitis Perineal hygeine reviewed Diflucan and gynelotrimin given Rt 1 month for aex

## 2012-09-20 ENCOUNTER — Emergency Department (HOSPITAL_COMMUNITY)
Admission: EM | Admit: 2012-09-20 | Discharge: 2012-09-20 | Disposition: A | Discharge status: Home / Self Care | Payer: No Typology Code available for payment source | Attending: Emergency Medicine | Admitting: Emergency Medicine

## 2012-09-20 ENCOUNTER — Emergency Department (HOSPITAL_COMMUNITY): Payer: No Typology Code available for payment source

## 2012-09-20 ENCOUNTER — Encounter (HOSPITAL_COMMUNITY): Payer: Self-pay | Admitting: Emergency Medicine

## 2012-09-20 DIAGNOSIS — Y9389 Activity, other specified: Secondary | ICD-10-CM | POA: Insufficient documentation

## 2012-09-20 DIAGNOSIS — Z87891 Personal history of nicotine dependence: Secondary | ICD-10-CM | POA: Insufficient documentation

## 2012-09-20 DIAGNOSIS — Z87440 Personal history of urinary (tract) infections: Secondary | ICD-10-CM | POA: Insufficient documentation

## 2012-09-20 DIAGNOSIS — Z8719 Personal history of other diseases of the digestive system: Secondary | ICD-10-CM | POA: Insufficient documentation

## 2012-09-20 DIAGNOSIS — S298XXA Other specified injuries of thorax, initial encounter: Secondary | ICD-10-CM | POA: Insufficient documentation

## 2012-09-20 DIAGNOSIS — Y92410 Unspecified street and highway as the place of occurrence of the external cause: Secondary | ICD-10-CM | POA: Insufficient documentation

## 2012-09-20 DIAGNOSIS — Z862 Personal history of diseases of the blood and blood-forming organs and certain disorders involving the immune mechanism: Secondary | ICD-10-CM | POA: Insufficient documentation

## 2012-09-20 DIAGNOSIS — S0993XA Unspecified injury of face, initial encounter: Secondary | ICD-10-CM | POA: Insufficient documentation

## 2012-09-20 DIAGNOSIS — Z87448 Personal history of other diseases of urinary system: Secondary | ICD-10-CM | POA: Insufficient documentation

## 2012-09-20 DIAGNOSIS — S199XXA Unspecified injury of neck, initial encounter: Secondary | ICD-10-CM | POA: Insufficient documentation

## 2012-09-20 DIAGNOSIS — Z8659 Personal history of other mental and behavioral disorders: Secondary | ICD-10-CM | POA: Insufficient documentation

## 2012-09-20 MED ORDER — TRAMADOL HCL 50 MG PO TABS: 50.0000 mg | ORAL_TABLET | Freq: Four times a day (QID) | ORAL | Status: DC | PRN

## 2012-09-20 MED ORDER — OXYCODONE-ACETAMINOPHEN 5-325 MG PO TABS
1.0000 | ORAL_TABLET | Freq: Once | ORAL | Status: AC
  Administered 2012-09-20: 1 via ORAL
  Filled 2012-09-20: qty 1
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
## 2012-10-14 ENCOUNTER — Ambulatory Visit: Payer: Medicaid Other | Admitting: Obstetrics and Gynecology

## 2012-12-02 ENCOUNTER — Encounter: Payer: Self-pay | Admitting: Obstetrics and Gynecology

## 2012-12-02 ENCOUNTER — Ambulatory Visit (INDEPENDENT_AMBULATORY_CARE_PROVIDER_SITE_OTHER): Payer: Medicaid Other | Admitting: Obstetrics and Gynecology

## 2012-12-02 VITALS — BP 102/72 | HR 84 | Ht 69.0 in | Wt 138.0 lb

## 2012-12-02 DIAGNOSIS — Z113 Encounter for screening for infections with a predominantly sexual mode of transmission: Secondary | ICD-10-CM

## 2012-12-02 DIAGNOSIS — Z0000 Encounter for general adult medical examination without abnormal findings: Secondary | ICD-10-CM

## 2012-12-02 DIAGNOSIS — D649 Anemia, unspecified: Secondary | ICD-10-CM

## 2012-12-02 DIAGNOSIS — Z124 Encounter for screening for malignant neoplasm of cervix: Secondary | ICD-10-CM

## 2012-12-02 LAB — CBC WITH DIFFERENTIAL/PLATELET
Basophils Relative: 0 % (ref 0–1)
Eosinophils Absolute: 0.1 10*3/uL (ref 0.0–0.7)
Eosinophils Relative: 2 % (ref 0–5)
Lymphs Abs: 1.8 10*3/uL (ref 0.7–4.0)
MCH: 29.8 pg (ref 26.0–34.0)
MCHC: 34.4 g/dL (ref 30.0–36.0)
MCV: 86.5 fL (ref 78.0–100.0)
Platelets: 258 10*3/uL (ref 150–400)
RBC: 4.3 MIL/uL (ref 3.87–5.11)
RDW: 12.6 % (ref 11.5–15.5)

## 2012-12-02 NOTE — Progress Notes (Signed)
Last Pap: 2011 per pt  WNL: Yes Regular Periods:yes Contraception: none   Monthly Breast exam:no Tetanus<80yrs:yes Nl.Bladder Function:yes Daily BMs:no Healthy Diet:no Calcium:no Mammogram:no Date of Mammogram: n/a Exercise:no Have often Exercise: n/a Seatbelt: yes Abuse at home: no Stressful work:no Sigmoid-colonoscopy: n/a Bone Density: No PCP: n/a Change in PMH: no changes  Change in [facility]: no changes  Pt would like her hemoglobin checked today.  BP 102/72  Pulse 84  Ht 5\' 9"  (1.753 m)  Wt 138 lb (62.596 kg)  BMI 20.38 kg/m2  LMP 11/07/2012 Pt with complaints:no Physical Examination: General appearance - alert, well appearing, and in no distress Mental status - normal mood, behavior, speech, dress, motor activity, and thought processes Neck - supple, no significant adenopathy,  thyroid exam: thyroid is normal in size without nodules or tenderness Chest - clear to auscultation, no wheezes, rales or rhonchi, symmetric air entry Heart - normal rate and regular rhythm Abdomen - soft, nontender, nondistended, no masses or organomegaly Breasts - breasts appear normal, no suspicious masses, no skin or nipple changes or axillary nodes Pelvic - normal external genitalia, vulva, vagina, cervix, uterus and adnexa Rectal - rectal exam not indicated Back exam - full range of motion, no tenderness, palpable spasm or pain on motion Neurological - alert, oriented, normal speech, no focal findings or movement disorder noted Musculoskeletal - no joint tenderness, deformity or swelling Extremities - no edema, redness or tenderness in the calves or thighs Skin - normal coloration and turgor, no rashes, no suspicious skin lesions noted Routine exam Pap sent yes Mammogram due no abstinant used for contraception RT 1 yr Check cbc pt with h/o anemia.  STD testing per pts request

## 2012-12-02 NOTE — Patient Instructions (Signed)
Breast Self-Awareness  Practicing breast self-awareness may pick up problems early, prevent significant medical complications, and possibly save your life. By practicing breast self-awareness, you can become familiar with how your breasts look and feel and if your breasts are changing. This allows you to notice changes early. It can also offer you some reassurance that your breast health is good. One way to learn what is normal for your breasts and whether your breasts are changing is to do a breast self-exam.  If you find a lump or something that was not present in the past, it is best to contact your caregiver right away. Other findings that should be evaluated by your caregiver include nipple discharge, especially if it is bloody; skin changes or reddening; areas where the skin seems to be pulled in (retracted); or new lumps and bumps. Breast pain is seldom associated with cancer (malignancy), but should also be evaluated by a caregiver.  BREAST SELF-EXAM  The best time to examine your breasts is 5 7 days after your menstrual period is over. During menstruation, the breasts are lumpier, and it may be more difficult to pick up changes. If you do not menstruate, have reached menopause, or had your uterus removed (hysterectomy), you should examine your breasts at regular intervals, such as monthly. If you are breastfeeding, examine your breasts after a feeding or after using a breast pump. Breast implants do not decrease the risk for lumps or tumors, so continue to perform breast self-exams as recommended. Talk to your caregiver about how to determine the difference between the implant and breast tissue. Also, talk about the amount of pressure you should use during the exam. Over time, you will become more familiar with the variations of your breasts and more comfortable with the exam. A breast self-exam requires you to remove all your clothes above the waist.    Look at your breasts and nipples. Stand in front of  a mirror in a room with good lighting. With your hands on your hips, push your hands firmly downward. Look for a difference in shape, contour, and size from one breast to the other (asymmetry). Asymmetry includes puckers, dips, or bumps. Also, look for skin changes, such as reddened or scaly areas on the breasts. Look for nipple changes, such as discharge, dimpling, repositioning, or redness.   Carefully feel your breasts. This is best done either in the shower or tub while using soapy water or when flat on your back. Place the arm (on the side of the breast you are examining) above your head. Use the pads (not the fingertips) of your three middle fingers on your opposite hand to feel your breasts. Start in the underarm area and use  inch (2 cm) overlapping circles to feel your breast. Use 3 different levels of pressure (light, medium, and firm pressure) at each circle before moving to the next circle. The light pressure is needed to feel the tissue closest to the skin. The medium pressure will help to feel breast tissue a little deeper, while the firm pressure is needed to feel the tissue close to the ribs. Continue the overlapping circles, moving downward over the breast until you feel your ribs below your breast. Then, move one finger-width towards the center of the body. Continue to use the  inch (2 cm) overlapping circles to feel your breast as you move slowly up toward the collar bone (clavicle) near the base of the neck. Continue the up and down exam using all 3 pressures   until you reach the middle of the chest. Do this with each breast, carefully feeling for lumps or changes.   Keep a written record with breast changes or normal findings for each breast. By writing this information down, you do not need to depend only on memory for size, tenderness, or location. Write down where you are in your menstrual cycle, if you are still menstruating.   Breast tissue can have some lumps or thick tissue. However,  see your caregiver if you find anything that concerns you.   SEEK MEDICAL CARE IF:   You see a change in shape, contour, or size of your breasts or nipples.    You see skin changes, such as reddened or scaly areas on the breasts or nipples.    You have an unusual discharge from your nipples.    You feel a new lump or unusually thick areas.   Document Released: 11/06/2005 Document Revised: 05/07/2012 Document Reviewed: 02/21/2012  [employer] Patient Information 2013 [employer].

## 2012-12-03 LAB — HEPATITIS C ANTIBODY: HCV Ab: NEGATIVE

## 2012-12-03 LAB — HEPATITIS B SURFACE ANTIGEN: Hepatitis B Surface Ag: NEGATIVE

## 2012-12-03 LAB — RPR

## 2012-12-03 LAB — HIV ANTIBODY (ROUTINE TESTING W REFLEX): HIV: NONREACTIVE

## 2012-12-04 LAB — PAP IG, CT-NG, RFX HPV ASCU: GC Probe Amp: NEGATIVE

## 2012-12-26 ENCOUNTER — Telehealth: Payer: Self-pay | Admitting: Obstetrics and Gynecology

## 2012-12-26 MED ORDER — LEVONORGESTREL 0.75 MG PO TABS: 0.7500 mg | ORAL_TABLET | Freq: Two times a day (BID) | ORAL | Status: DC

## 2012-12-26 NOTE — Telephone Encounter (Signed)
Tc to pt per telephone call. Pt req Plan B rx to be called to pharm. LMP-12/06/12. Last unprotected IC-today. Rx for Plan B e-pres to pharm on file. Told to cb if no menses within 21 days. Pt voices understanding.

## 2013-03-10 ENCOUNTER — Encounter (HOSPITAL_COMMUNITY): Payer: Self-pay | Admitting: *Deleted

## 2013-03-10 ENCOUNTER — Emergency Department (HOSPITAL_COMMUNITY)
Admission: EM | Admit: 2013-03-10 | Discharge: 2013-03-10 | Disposition: A | Discharge status: Home / Self Care | Payer: Medicaid Other | Source: Home / Self Care | Attending: Family Medicine | Admitting: Family Medicine

## 2013-03-10 DIAGNOSIS — J302 Other seasonal allergic rhinitis: Secondary | ICD-10-CM

## 2013-03-10 DIAGNOSIS — J309 Allergic rhinitis, unspecified: Secondary | ICD-10-CM

## 2013-03-10 MED ORDER — FLUTICASONE PROPIONATE 50 MCG/ACT NA SUSP: 1.0000 | Freq: Two times a day (BID) | NASAL | Status: DC

## 2013-03-10 MED ORDER — METHYLPREDNISOLONE ACETATE 40 MG/ML IJ SUSP
80.0000 mg | Freq: Once | INTRAMUSCULAR | Status: AC
  Administered 2013-03-10: 80 mg via INTRAMUSCULAR

## 2013-03-10 MED ORDER — FLUCONAZOLE 150 MG PO TABS: 150.0000 mg | ORAL_TABLET | Freq: Once | ORAL | Status: DC

## 2013-03-10 MED ORDER — FEXOFENADINE HCL 180 MG PO TABS: 180.0000 mg | ORAL_TABLET | Freq: Every day | ORAL | Status: DC

## 2013-03-10 MED ORDER — METHYLPREDNISOLONE ACETATE 80 MG/ML IJ SUSP
INTRAMUSCULAR | Status: AC
  Filled 2013-03-10: qty 1

## 2013-03-10 NOTE — ED Notes (Signed)
Called to check pt. at registration for chest pain and SOB. Pt. Has had runny nose, sneezing, chest tightness and SOB x 3 days. No acute distress.  O2 sat 100 % pulse 69. Pt. told she can wait in waiting room until we call her to a room.

## 2013-03-10 NOTE — ED Provider Notes (Signed)
History     CSN: [id number]  Arrival date & time 03/10/13  1753   First MD Initiated Contact with Patient 03/10/13 1823      Chief Complaint  Patient presents with  . URI  . Vaginitis    (Consider location/radiation/quality/duration/timing/severity/associated sxs/prior treatment) Patient is a 22 y.o. female presenting with URI. The history is provided by the patient.  URI Presenting symptoms: congestion, cough and rhinorrhea   Presenting symptoms: no fever and no sore throat   Severity:  Mild Duration:  3 days Timing:  Intermittent Chronicity:  New Associated symptoms: sneezing   Associated symptoms: no wheezing     Past Medical History  Diagnosis Date  . Blood transfusion   . Hx of blood clots   . H/O bladder infections   . Post partum depression   . Anorectal fissure   . Cervicitis     Past Surgical History  Procedure Laterality Date  . Wisdom tooth extraction      Family History  Problem Relation Age of Onset  . Cancer Maternal Grandfather   . Migraines Mother   . Gallbladder disease Mother   . Nephrolithiasis Mother   . Asthma Mother   . Hernia Brother   . Asthma Daughter   . Cancer Maternal Aunt     History  Substance Use Topics  . Smoking status: Former Games developer  . Smokeless tobacco: Not on file  . Alcohol Use: No    OB History   Grav Para Term Preterm Abortions TAB SAB Ect Mult Living   1 1        1       Review of Systems  Constitutional: Negative.  Negative for fever.  HENT: Positive for congestion, rhinorrhea, sneezing and postnasal drip. Negative for sore throat.   Respiratory: Positive for cough. Negative for wheezing.   Cardiovascular: Negative.   Gastrointestinal: Negative.     Allergies  Strawberry  Home Medications   Current Outpatient Rx  Name  Route  Sig  Dispense  Refill  . fexofenadine (ALLEGRA) 180 MG tablet   Oral   Take 1 tablet (180 mg total) by mouth daily.   30 tablet   1   . fluconazole (DIFLUCAN) 150 MG  tablet   Oral   Take 1 tablet (150 mg total) by mouth once. May repeat in 1 week.   1 tablet   1   . fluticasone (FLONASE) 50 MCG/ACT nasal spray   Nasal   Place 1 spray into the nose 2 (two) times daily.   1 g   2   . levonorgestrel (PLAN B,NEXT CHOICE) 0.75 MG tablet   Oral   Take 1 tablet (0.75 mg total) by mouth every 12 (twelve) hours.   2 tablet   0   . Multiple Vitamins-Minerals (MULTIVITAMIN WITH MINERALS) tablet   Oral   Take 1 tablet by mouth daily.         . traMADol (ULTRAM) 50 MG tablet   Oral   Take 1 tablet (50 mg total) by mouth every 6 (six) hours as needed for pain.   20 tablet   0     BP 121/53  Pulse 77  Temp(Src) 98.3 F (36.8 C) (Oral)  Resp 18  SpO2 100%  LMP 03/06/2013  Physical Exam  Nursing note and vitals reviewed. Constitutional: She is oriented to person, place, and time. She appears well-developed and well-nourished.  HENT:  Head: Normocephalic.  Right Ear: External ear normal.  Left Ear: External  ear normal.  Nose: Mucosal edema and rhinorrhea present.  Mouth/Throat: Oropharynx is clear and moist.  Neck: Normal range of motion. Neck supple.  Cardiovascular: Normal rate, regular rhythm and normal heart sounds.   Pulmonary/Chest: Effort normal and breath sounds normal.  Lymphadenopathy:    She has no cervical adenopathy.  Neurological: She is alert and oriented to person, place, and time.  Skin: Skin is warm and dry.    ED Course  Procedures (including critical care time)  Labs Reviewed - No data to display No results found.   1. Seasonal allergic rhinitis       MDM          Linna Hoff, MD 03/10/13 1907

## 2013-03-10 NOTE — ED Notes (Signed)
C/o chest pain onset 3 days ago with SOB, feels like you phlegm in her throat and tries to cough it out but nothing comes out.  No chills or fever.  No sore throat, or earache, but has a runny nose.  Also c/o white thick discharge and sometimes filmy discharge for 1 month with itching.  No UTI symptoms

## 2014-01-23 ENCOUNTER — Emergency Department (HOSPITAL_COMMUNITY)
Admission: EM | Admit: 2014-01-23 | Discharge: 2014-01-24 | Disposition: A | Discharge status: Home / Self Care | Payer: Medicaid Other | Attending: Emergency Medicine | Admitting: Emergency Medicine

## 2014-01-23 ENCOUNTER — Encounter (HOSPITAL_COMMUNITY): Payer: Self-pay | Admitting: Emergency Medicine

## 2014-01-23 DIAGNOSIS — Z8719 Personal history of other diseases of the digestive system: Secondary | ICD-10-CM | POA: Insufficient documentation

## 2014-01-23 DIAGNOSIS — R112 Nausea with vomiting, unspecified: Secondary | ICD-10-CM

## 2014-01-23 DIAGNOSIS — R109 Unspecified abdominal pain: Secondary | ICD-10-CM

## 2014-01-23 DIAGNOSIS — Z8742 Personal history of other diseases of the female genital tract: Secondary | ICD-10-CM | POA: Insufficient documentation

## 2014-01-23 DIAGNOSIS — Z86718 Personal history of other venous thrombosis and embolism: Secondary | ICD-10-CM | POA: Insufficient documentation

## 2014-01-23 DIAGNOSIS — Z87891 Personal history of nicotine dependence: Secondary | ICD-10-CM | POA: Insufficient documentation

## 2014-01-23 DIAGNOSIS — Z3202 Encounter for pregnancy test, result negative: Secondary | ICD-10-CM | POA: Insufficient documentation

## 2014-01-23 DIAGNOSIS — Z87448 Personal history of other diseases of urinary system: Secondary | ICD-10-CM | POA: Insufficient documentation

## 2014-01-23 DIAGNOSIS — Z8659 Personal history of other mental and behavioral disorders: Secondary | ICD-10-CM | POA: Insufficient documentation

## 2014-01-23 DIAGNOSIS — R1084 Generalized abdominal pain: Secondary | ICD-10-CM | POA: Insufficient documentation

## 2014-01-23 MED ORDER — ONDANSETRON 8 MG PO TBDP
8.0000 mg | ORAL_TABLET | Freq: Once | ORAL | Status: AC
  Administered 2014-01-23: 8 mg via ORAL
  Filled 2014-01-23: qty 1

## 2014-01-23 NOTE — ED Notes (Signed)
Pt c/o emesis onset this am upon waking x 5 today. HA today, pelvic pressure without dysuria.

## 2014-01-24 ENCOUNTER — Emergency Department (HOSPITAL_COMMUNITY): Payer: Medicaid Other

## 2014-01-24 LAB — CBC WITH DIFFERENTIAL/PLATELET
Basophils Absolute: 0 10*3/uL (ref 0.0–0.1)
Basophils Relative: 0 % (ref 0–1)
EOS ABS: 0 10*3/uL (ref 0.0–0.7)
Eosinophils Relative: 0 % (ref 0–5)
HCT: 41.8 % (ref 36.0–46.0)
HEMOGLOBIN: 14.5 g/dL (ref 12.0–15.0)
LYMPHS ABS: 0.3 10*3/uL — AB (ref 0.7–4.0)
LYMPHS PCT: 4 % — AB (ref 12–46)
MCH: 30.1 pg (ref 26.0–34.0)
MCHC: 34.7 g/dL (ref 30.0–36.0)
MCV: 86.7 fL (ref 78.0–100.0)
MONOS PCT: 3 % (ref 3–12)
Monocytes Absolute: 0.3 10*3/uL (ref 0.1–1.0)
NEUTROS PCT: 93 % — AB (ref 43–77)
Neutro Abs: 7.5 10*3/uL (ref 1.7–7.7)
PLATELETS: 213 10*3/uL (ref 150–400)
RBC: 4.82 MIL/uL (ref 3.87–5.11)
RDW: 11.5 % (ref 11.5–15.5)
WBC: 8 10*3/uL (ref 4.0–10.5)

## 2014-01-24 LAB — URINALYSIS, ROUTINE W REFLEX MICROSCOPIC
BILIRUBIN URINE: NEGATIVE
GLUCOSE, UA: NEGATIVE mg/dL
HGB URINE DIPSTICK: NEGATIVE
KETONES UR: 15 mg/dL — AB
Leukocytes, UA: NEGATIVE
Nitrite: NEGATIVE
PROTEIN: NEGATIVE mg/dL
Specific Gravity, Urine: 1.017 (ref 1.005–1.030)
UROBILINOGEN UA: 1 mg/dL (ref 0.0–1.0)
pH: 7 (ref 5.0–8.0)

## 2014-01-24 LAB — WET PREP, GENITAL
CLUE CELLS WET PREP: NONE SEEN
TRICH WET PREP: NONE SEEN
Yeast Wet Prep HPF POC: NONE SEEN

## 2014-01-24 LAB — COMPREHENSIVE METABOLIC PANEL
ALK PHOS: 71 U/L (ref 39–117)
ALT: 9 U/L (ref 0–35)
AST: 20 U/L (ref 0–37)
Albumin: 4.5 g/dL (ref 3.5–5.2)
BILIRUBIN TOTAL: 0.6 mg/dL (ref 0.3–1.2)
BUN: 11 mg/dL (ref 6–23)
CO2: 24 meq/L (ref 19–32)
Calcium: 9.9 mg/dL (ref 8.4–10.5)
Chloride: 97 mEq/L (ref 96–112)
Creatinine, Ser: 0.72 mg/dL (ref 0.50–1.10)
GLUCOSE: 97 mg/dL (ref 70–99)
POTASSIUM: 4.2 meq/L (ref 3.7–5.3)
SODIUM: 136 meq/L — AB (ref 137–147)
TOTAL PROTEIN: 8.5 g/dL — AB (ref 6.0–8.3)

## 2014-01-24 LAB — LIPASE, BLOOD: LIPASE: 39 U/L (ref 11–59)

## 2014-01-24 LAB — POC URINE PREG, ED: Preg Test, Ur: NEGATIVE

## 2014-01-24 MED ORDER — ONDANSETRON HCL 4 MG PO TABS: 4.0000 mg | ORAL_TABLET | Freq: Four times a day (QID) | ORAL | Status: DC

## 2014-01-24 MED ORDER — IOHEXOL 300 MG/ML  SOLN
50.0000 mL | Freq: Once | INTRAMUSCULAR | Status: AC | PRN
  Administered 2014-01-24: 50 mL via ORAL

## 2014-01-24 MED ORDER — IOHEXOL 300 MG/ML  SOLN
100.0000 mL | Freq: Once | INTRAMUSCULAR | Status: AC | PRN
  Administered 2014-01-24: 100 mL via INTRAVENOUS

## 2014-01-24 MED ORDER — SODIUM CHLORIDE 0.9 % IV BOLUS (SEPSIS)
1000.0000 mL | Freq: Once | INTRAVENOUS | Status: AC
  Administered 2014-01-24: 1000 mL via INTRAVENOUS

## 2014-01-24 MED ORDER — MORPHINE SULFATE 4 MG/ML IJ SOLN
4.0000 mg | Freq: Once | INTRAMUSCULAR | Status: AC
  Administered 2014-01-24: 4 mg via INTRAVENOUS
  Filled 2014-01-24: qty 1

## 2014-01-24 MED ORDER — SODIUM CHLORIDE 0.9 % IV BOLUS (SEPSIS)
1000.0000 mL | Freq: Once | INTRAVENOUS | Status: AC
Start: 2014-01-24 — End: 2014-01-24
  Administered 2014-01-24: 1000 mL via INTRAVENOUS

## 2014-01-24 NOTE — ED Provider Notes (Signed)
Medical screening examination/treatment/procedure(s) were conducted as a shared visit with non-physician practitioner(s) and myself.  I personally evaluated the patient during the encounter.  Patient with vague lower abdominal pain, nausea and vomiting. Tenderness over the RLQ. Labs reassuring. CT scan obtained and is negative for acute intra abd process. Unremarkable pelvic exam per MLP note. Patient stable for d/c with plan for symptomatic management.    Brandt LoosenJulie Manly, MD 01/24/14 [phone]

## 2014-01-24 NOTE — ED Provider Notes (Signed)
CSN: [id number]     Arrival date & time 01/23/14  2306 History   First MD Initiated Contact with Patient 01/23/14 2350     Chief Complaint  Patient presents with  . Emesis     (Consider location/radiation/quality/duration/timing/severity/associated sxs/prior Treatment) Patient is a 23 y.o. female presenting with vomiting.  Emesis  23 you female presents with lower abdominal pain that started early this morning at 8am. Patient admits to Nausea and vomiting x 5. Denies any hematemesis. Admits to Shortness of breath when she stands that started today. Denies any current SOB but admits to DOE. Admits to lower abdominal "pressure" pain that is constant rated at 7/10 without radiation. Patient denies taking anything for pain. States pain improved after vomiting episodes but then returns. Patient appetite is poor today due to not being able to hold anything down. Denies any urinary sxs. LMP was 01/10/14. Patient is sexually active with 1 partner. Patient denies any birth control use, hx of DVT, recent trauma or surgery, hemoptysis, or unilateral leg swelling.      Past Medical History  Diagnosis Date  . Blood transfusion   . Hx of blood clots   . H/O bladder infections   . Post partum depression   . Anorectal fissure   . Cervicitis    Past Surgical History  Procedure Laterality Date  . Wisdom tooth extraction     Family History  Problem Relation Age of Onset  . Cancer Maternal Grandfather   . Migraines Mother   . Gallbladder disease Mother   . Nephrolithiasis Mother   . Asthma Mother   . Hernia Brother   . Asthma Daughter   . Cancer Maternal Aunt    History  Substance Use Topics  . Smoking status: Former Games developermoker  . Smokeless tobacco: Not on file  . Alcohol Use: Yes     Comment: occasional   OB History   Grav Para Term Preterm Abortions TAB SAB Ect Mult Living   1 1        1      Review of Systems  Gastrointestinal: Positive for vomiting.  All other systems reviewed and  are negative.      Allergies  Fish allergy and Strawberry  Home Medications   No current outpatient prescriptions on file. BP 108/53  Pulse 100  Temp(Src) 99.3 F (37.4 C) (Oral)  Resp 16  Wt 145 lb (65.772 kg)  SpO2 100%  LMP 01/12/2014 Physical Exam  Nursing note and vitals reviewed. Constitutional: She is oriented to person, place, and time. She appears well-developed and well-nourished. No distress.  HENT:  Head: Normocephalic and atraumatic.  Eyes: Conjunctivae and EOM are normal. Pupils are equal, round, and reactive to light. No scleral icterus.  Neck: Normal range of motion. Neck supple. No JVD present. No tracheal deviation present.  Cardiovascular: Normal rate and regular rhythm.  Exam reveals no gallop and no friction rub.   No murmur heard. Pulmonary/Chest: Effort normal and breath sounds normal. No respiratory distress. She has no wheezes. She has no rhonchi. She has no rales.  Abdominal: Soft. She exhibits no distension. Bowel sounds are increased. There is no hepatosplenomegaly. There is generalized tenderness. There is no rigidity, no rebound, no guarding, no tenderness at McBurney's point and negative Murphy's sign.  Negative Psoas sign Negative Obturator sign   Genitourinary: Uterus normal. Pelvic exam was performed with patient supine. There is no rash, tenderness, lesion or injury on the right labia. There is no rash, tenderness, lesion or  injury on the left labia. Cervix exhibits no motion tenderness, no discharge and no friability. Right adnexum displays no mass, no tenderness and no fullness. Left adnexum displays no mass, no tenderness and no fullness. No erythema, tenderness or bleeding around the vagina. No foreign body around the vagina. No signs of injury around the vagina. No vaginal discharge found.  Musculoskeletal: Normal range of motion. She exhibits no edema.  Neurological: She is alert and oriented to person, place, and time. She has normal  strength. No cranial nerve deficit or sensory deficit.  Skin: Skin is warm and dry. She is not diaphoretic.  Psychiatric: She has a normal mood and affect. Her behavior is normal.    ED Course  Procedures (including critical care time) Labs Review Labs Reviewed  WET PREP, GENITAL - Abnormal; Notable for the following:    WBC, Wet Prep HPF POC FEW (*)    All other components within normal limits  CBC WITH DIFFERENTIAL - Abnormal; Notable for the following:    Neutrophils Relative % 93 (*)    Lymphocytes Relative 4 (*)    Lymphs Abs 0.3 (*)    All other components within normal limits  COMPREHENSIVE METABOLIC PANEL - Abnormal; Notable for the following:    Sodium 136 (*)    Total Protein 8.5 (*)    All other components within normal limits  URINALYSIS, ROUTINE W REFLEX MICROSCOPIC - Abnormal; Notable for the following:    Ketones, ur 15 (*)    All other components within normal limits  GC/CHLAMYDIA PROBE AMP  LIPASE, BLOOD  POC URINE PREG, ED   Imaging Review No results found.   EKG Interpretation None      MDM   Final diagnoses:  Abdominal pain  Nausea & vomiting   Patient afebrile. Oral temp trending up, most recent temp 99.7 (orally), patient refuses rectal temp.  Mild hyponatremia at 136. Patient given IV fluids.  Lipase negative. Doubt pancreatitis Urine preg negative. Doubt Ectopic. UA negative for UTI or Hematuria No leukocytosis Wet prep shows few WBCs, otherwise negative. Pain and nausea improved with tx in ED. Patient tolerating POs in ED.  Will get CT abdomen/pelvis w/ contrast.   Patient signed over to Dr. Brandt Loosen, at shift change. Disposition pending CT results.   Meds given in ED:  Medications  ondansetron (ZOFRAN-ODT) disintegrating tablet 8 mg (8 mg Oral Given 01/23/14 2335)  sodium chloride 0.9 % bolus 1,000 mL (0 mLs Intravenous Stopped 01/24/14 0134)  morphine 4 MG/ML injection 4 mg (4 mg Intravenous Given 01/24/14 0126)  sodium chloride 0.9  % bolus 1,000 mL (0 mLs Intravenous Stopped 01/24/14 0310)    New Prescriptions   No medications on file          Rudene Anda, PA-C 01/24/14 [phone]

## 2014-01-24 NOTE — Discharge Instructions (Signed)
Abdominal Pain, Adult °Many things can cause abdominal pain. Usually, abdominal pain is not caused by a disease and will improve without treatment. It can often be observed and treated at home. Your health care provider will do a physical exam and possibly order blood tests and X-rays to help determine the seriousness of your pain. However, in many cases, more time must pass before a clear cause of the pain can be found. Before that point, your health care provider may not know if you need more testing or further treatment. °HOME CARE INSTRUCTIONS  °Monitor your abdominal pain for any changes. The following actions may help to alleviate any discomfort you are experiencing: °· Only take over-the-counter or prescription medicines as directed by your health care provider. °· Do not take laxatives unless directed to do so by your health care provider. °· Try a clear liquid diet (broth, tea, or water) as directed by your health care provider. Slowly move to a bland diet as tolerated. °SEEK MEDICAL CARE IF: °· You have unexplained abdominal pain. °· You have abdominal pain associated with nausea or diarrhea. °· You have pain when you urinate or have a bowel movement. °· You experience abdominal pain that wakes you in the night. °· You have abdominal pain that is worsened or improved by eating food. °· You have abdominal pain that is worsened with eating fatty foods. °SEEK IMMEDIATE MEDICAL CARE IF:  °· Your pain does not go away within 2 hours. °· You have a fever. °· You keep throwing up (vomiting). °· Your pain is felt only in portions of the abdomen, such as the right side or the left lower portion of the abdomen. °· You pass bloody or black tarry stools. °MAKE SURE YOU: °· Understand these instructions.   °· Will watch your condition.   °· Will get help right away if you are not doing well or get worse.   °Document Released: 08/16/2005 Document Revised: 08/27/2013 Document Reviewed: 07/16/2013 °ExitCare® Patient  Information ©2014 [employer]. ° °

## 2014-01-25 LAB — GC/CHLAMYDIA PROBE AMP
CT PROBE, AMP APTIMA: NEGATIVE
GC Probe RNA: NEGATIVE

## 2014-06-05 ENCOUNTER — Encounter (HOSPITAL_COMMUNITY): Payer: Self-pay | Admitting: Emergency Medicine

## 2014-06-05 ENCOUNTER — Emergency Department (HOSPITAL_COMMUNITY)
Admission: EM | Admit: 2014-06-05 | Discharge: 2014-06-06 | Disposition: A | Discharge status: Home / Self Care | Payer: Medicaid Other | Attending: Emergency Medicine | Admitting: Emergency Medicine

## 2014-06-05 DIAGNOSIS — Z86718 Personal history of other venous thrombosis and embolism: Secondary | ICD-10-CM | POA: Diagnosis not present

## 2014-06-05 DIAGNOSIS — Z79899 Other long term (current) drug therapy: Secondary | ICD-10-CM | POA: Insufficient documentation

## 2014-06-05 DIAGNOSIS — Z87891 Personal history of nicotine dependence: Secondary | ICD-10-CM | POA: Diagnosis not present

## 2014-06-05 DIAGNOSIS — R110 Nausea: Secondary | ICD-10-CM | POA: Diagnosis present

## 2014-06-05 DIAGNOSIS — Z8659 Personal history of other mental and behavioral disorders: Secondary | ICD-10-CM | POA: Insufficient documentation

## 2014-06-05 DIAGNOSIS — Z8719 Personal history of other diseases of the digestive system: Secondary | ICD-10-CM | POA: Diagnosis not present

## 2014-06-05 DIAGNOSIS — N946 Dysmenorrhea, unspecified: Secondary | ICD-10-CM

## 2014-06-05 DIAGNOSIS — Z3202 Encounter for pregnancy test, result negative: Secondary | ICD-10-CM | POA: Diagnosis not present

## 2014-06-05 LAB — PREGNANCY, URINE: PREG TEST UR: NEGATIVE

## 2014-06-05 MED ORDER — ONDANSETRON 4 MG PO TBDP
4.0000 mg | ORAL_TABLET | Freq: Once | ORAL | Status: AC
  Administered 2014-06-05: 4 mg via ORAL
  Filled 2014-06-05: qty 1

## 2014-06-05 NOTE — ED Provider Notes (Signed)
CSN: [id number]     Arrival date & time 06/05/14  2253 History  This chart was scribed for non-physician practitioner Marlon Pel working with Vida Roller, MD by Carl Best, ED Scribe. This patient was seen in room WA25/WA25 and the patient's care was started at 11:37 PM.     Chief Complaint  Patient presents with  . Nausea  . Dysmenorrhea   The history is provided by the patient. No language interpreter was used.   HPI Comments: Stacey Mann is a 23 y.o. female who presents to the Emergency Department complaining of constant nausea and intermittent dysmenorrhea in her lower abdomen.  She states that her dysmenorrhea started a couple of days ago.  The patient states that her LNMP was 1 and 1/2 months ago.  The patient states that she has tried drinking water and ginger ale to treat her nausea.  The patient denies taking any medication to treat her dysmenorrhea.  She denies vaginal discharge, abnormal weight gain, vaginal pain, and spotting as associated symptoms.  The patient states that she last had sex 2 months ago but always uses protection.  She states that she has a history of pregnancy but states that her symptoms are not similar to those she experienced in the past.  She states that she is a nonsmoker.     Past Medical History  Diagnosis Date  . Blood transfusion   . Hx of blood clots   . H/O bladder infections   . Post partum depression   . Anorectal fissure   . Cervicitis    Past Surgical History  Procedure Laterality Date  . Wisdom tooth extraction     Family History  Problem Relation Age of Onset  . Cancer Maternal Grandfather   . Migraines Mother   . Gallbladder disease Mother   . Nephrolithiasis Mother   . Asthma Mother   . Hernia Brother   . Asthma Daughter   . Cancer Maternal Aunt    History  Substance Use Topics  . Smoking status: Former Games developer  . Smokeless tobacco: Not on file  . Alcohol Use: Yes     Comment: occasional   OB History    Grav Para Term Preterm Abortions TAB SAB Ect Mult Living   1 1        1      Review of Systems  Constitutional: Negative for unexpected weight change.  Gastrointestinal: Positive for nausea. Negative for vomiting.  Genitourinary: Negative for vaginal bleeding, vaginal discharge and vaginal pain.  All other systems reviewed and are negative.     Allergies  Fish allergy and Strawberry  Home Medications   Prior to Admission medications   Medication Sig Start Date End Date Taking? Authorizing Provider  ketoconazole (NIZORAL) 2 % cream Apply 1 application topically daily.   Yes Historical Provider, MD  pimecrolimus (ELIDEL) 1 % cream Apply 1 application topically 2 (two) times daily.   Yes Historical Provider, MD   Triage Vitals: BP 111/64  Pulse 64  Temp(Src) 99.2 F (37.3 C) (Oral)  Resp 16  SpO2 100%  LMP 04/22/2014  Physical Exam  Nursing note and vitals reviewed. Constitutional: She is oriented to person, place, and time. She appears well-developed and well-nourished.  HENT:  Head: Normocephalic and atraumatic.  Eyes: EOM are normal.  Neck: Normal range of motion.  Cardiovascular: Normal rate.   Pulmonary/Chest: Effort normal.  Abdominal: There is no tenderness. There is no rigidity, no guarding and no CVA tenderness.  Musculoskeletal:  Normal range of motion.  Neurological: She is alert and oriented to person, place, and time.  Skin: Skin is warm and dry.  Psychiatric: She has a normal mood and affect. Her behavior is normal.    ED Course  Procedures (including critical care time)  DIAGNOSTIC STUDIES: Oxygen Saturation is 100% on room air, normal by my interpretation.    COORDINATION OF CARE: 11:40 PM- The patient declined wanting any medication to treat her nausea in the ED.  The patient stated that she would like to have a pelvic exam performed in the ED.  D[facility] the [facility]  The patient agreed to the  treatment plan.   Labs Review Labs Reviewed  GC/CHLAMYDIA PROBE AMP  WET PREP, GENITAL  PREGNANCY, URINE    Imaging Review No results found.   EKG Interpretation None      MDM   Final diagnoses:  Dysmenorrhea    Pt moved to an acute bed for pelvic exam, wet prep, and gc. Right before doing the exam the patient changed her mind and decided she no longer wants to do a pelvic exam. She is having no discharge, fevers, or abdominal pains. She is later on her period and having intemrittent cramping, i will not force her to sign out AMA. Referral to womens given.  Pt declined any pain or nausea medications  23 y.o.Stacey Mann's evaluation in the Emergency Department is complete. It has been determined that no acute conditions requiring further emergency intervention are present at this time. The patient/guardian have been advised of the diagnosis and plan. We have discussed signs and symptoms that warrant return to the ED, such as changes or worsening in symptoms.  Vital signs are stable at discharge. Filed Vitals:   06/05/14 2316  BP: 111/64  Pulse: 64  Temp: 99.2 F (37.3 C)  Resp: 16    Patient/guardian has voiced understanding and agreed to follow-up with the PCP or specialist.  I personally performed the services described in this documentation, which was scribed in my presence. The recorded information has been reviewed and is accurate.    Dorthula Matasiffany G Keashia Haskins, PA-C 06/06/14 0010

## 2014-06-05 NOTE — ED Notes (Signed)
Pt states her last period was about 1 1/2 months ago. Pt states she has been having strong cramps off and on. Pt states she is nauseous now. Denies cramps at present.

## 2014-06-06 NOTE — Discharge Instructions (Signed)

## 2014-06-06 NOTE — ED Notes (Signed)
Pt came out to nursing station and said that she did not want the pelvic exam anymore. Tiffany PA made aware.

## 2014-06-06 NOTE — ED Provider Notes (Signed)
Medical screening examination/treatment/procedure(s) were performed by non-physician practitioner and as supervising physician I was immediately available for consultation/collaboration.    Mika Griffitts D Lizette Pazos, MD 06/06/14 0304 

## 2014-09-21 ENCOUNTER — Encounter (HOSPITAL_COMMUNITY): Payer: Self-pay | Admitting: Emergency Medicine

## 2014-09-26 IMAGING — CT CT ABD-PELV W/ CM
1 of 2 series · 15 of 32 positions shown, 19 images · IV contrast (OMNIPAQUE 300)
Comparison: None.

CLINICAL DATA: Emesis

EXAM:
CT ABDOMEN AND PELVIS WITH CONTRAST
TECHNIQUE: Multidetector CT imaging of the abdomen and pelvis was performed
using the standard protocol following bolus administration of
intravenous contrast.
CONTRAST:  50mL OMNIPAQUE IOHEXOL 300 MG/ML SOLN, 100mL OMNIPAQUE
IOHEXOL 300 MG/ML SOLN

[Series 2: abd/pel with · axial · 0.74mm/px · z∈[-425,-35]mm · 15 of 86 slices shown, 19 images]
[im 4/86  soft-tissue]
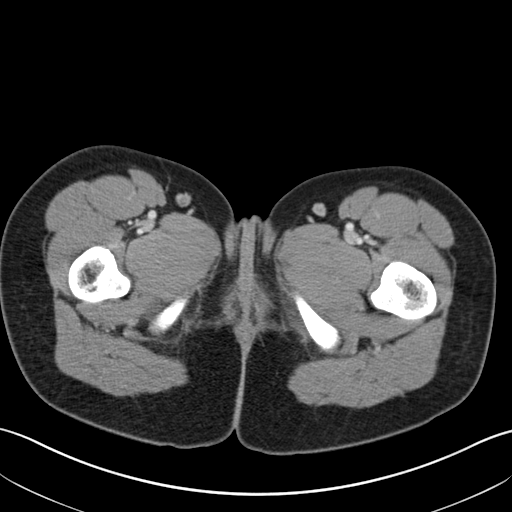
[im 4/86  bone]
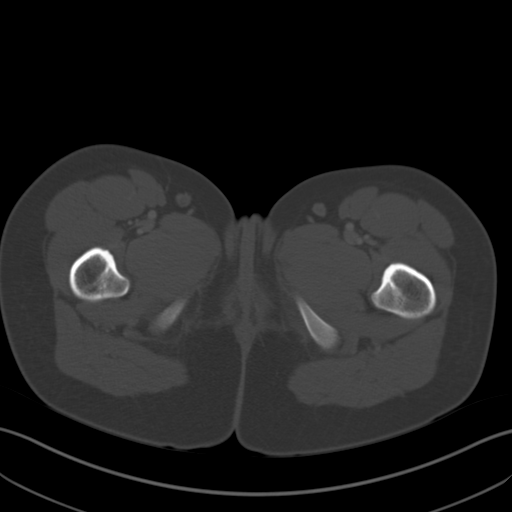
[im 11/86  soft-tissue]
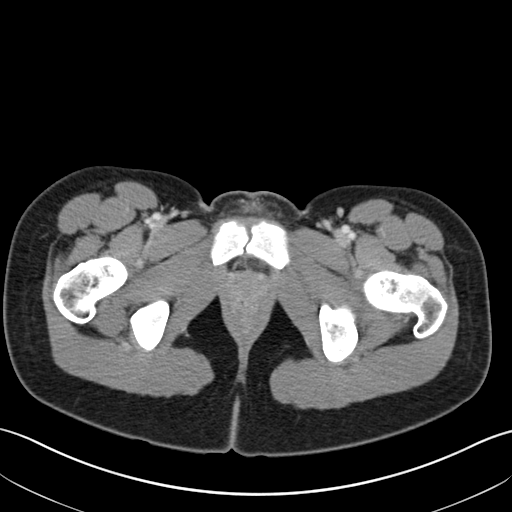
[im 18/86  soft-tissue]
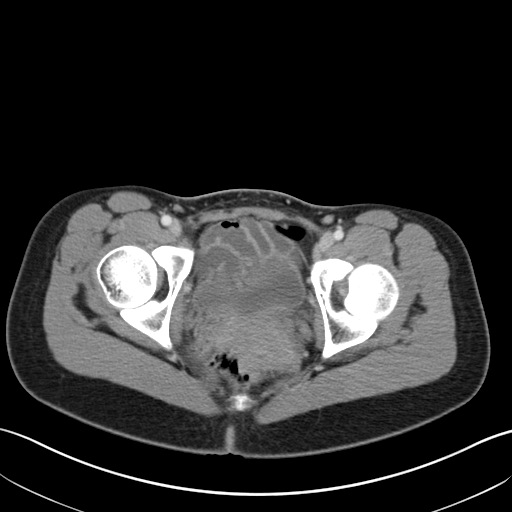
[im 25/86  soft-tissue]
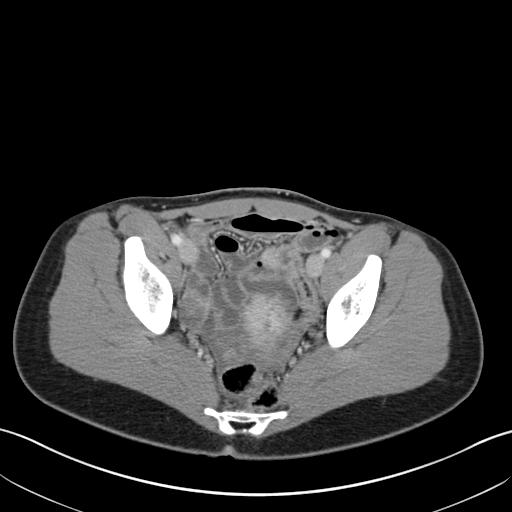
[im 29/86  soft-tissue]
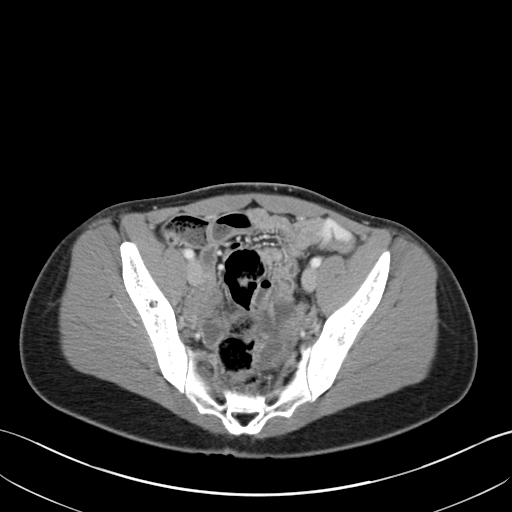
[im 36/86  soft-tissue]
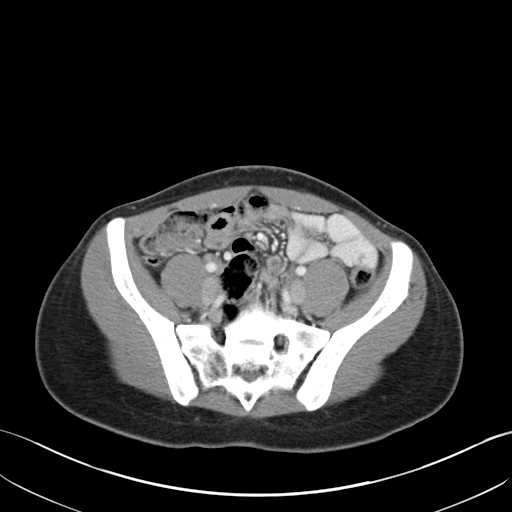
[im 43/86  soft-tissue]
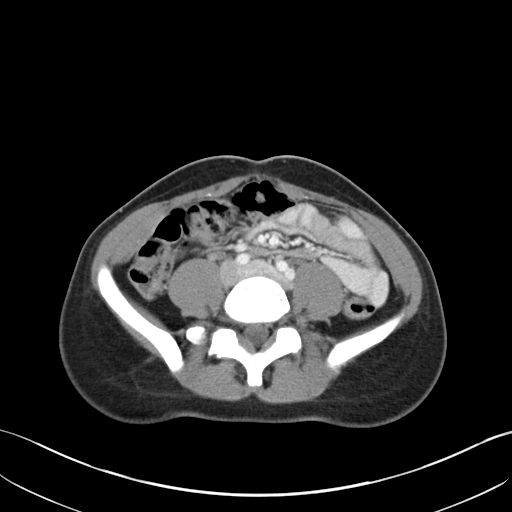
[im 50/86  soft-tissue]
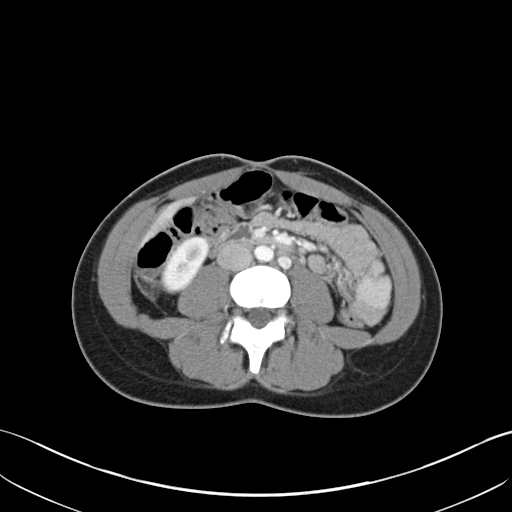
[im 57/86  soft-tissue]
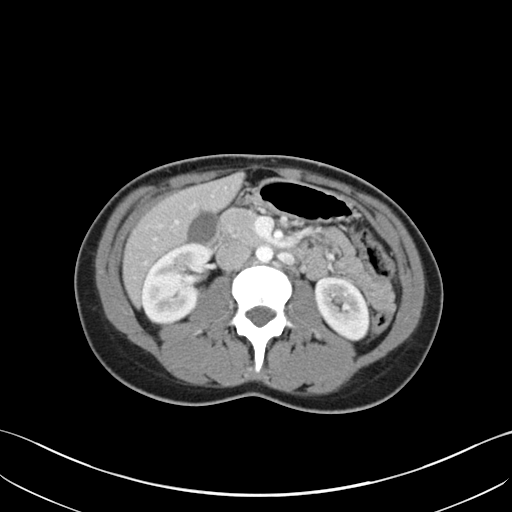
[im 57/86  bone]
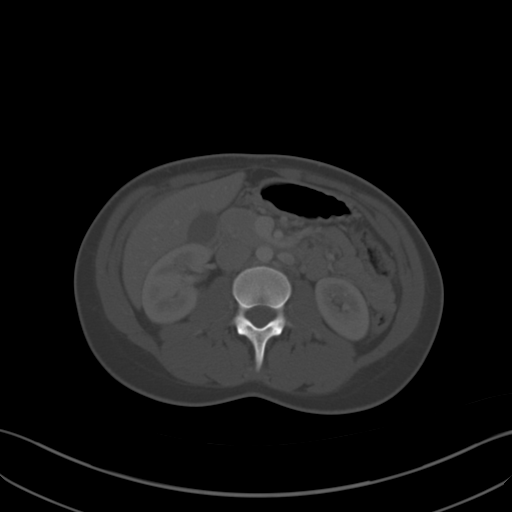
[im 61/86  soft-tissue]
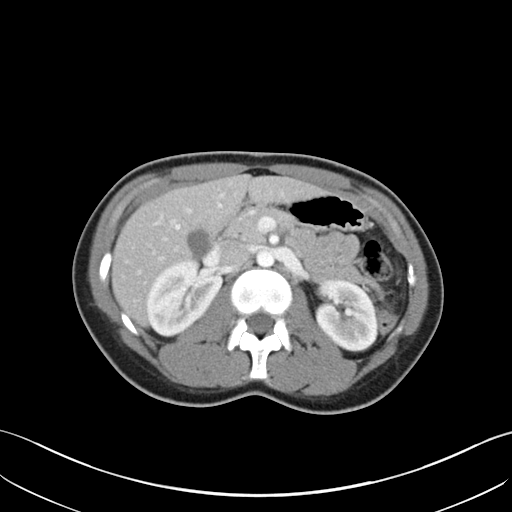
[im 68/86  soft-tissue]
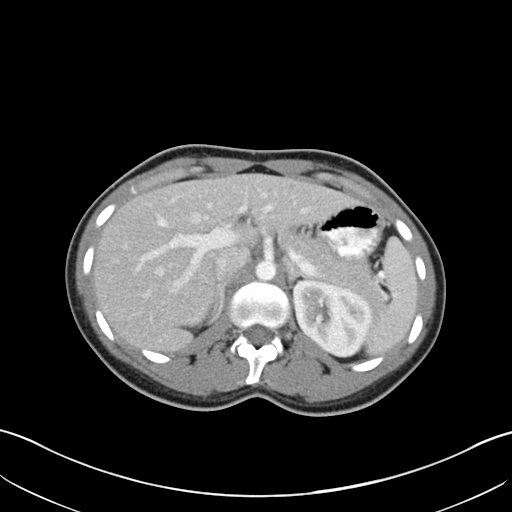
[im 71/86  lung]
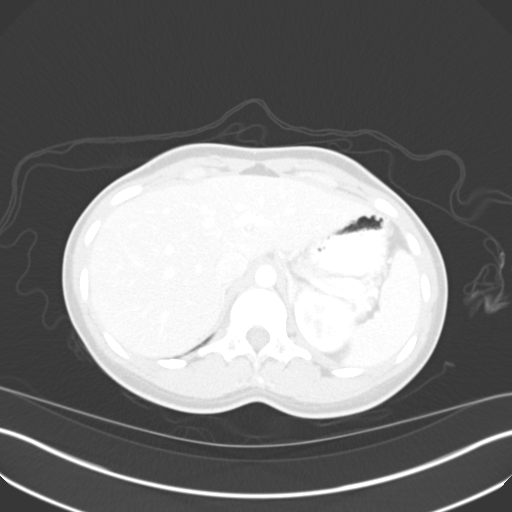
[im 75/86  soft-tissue]
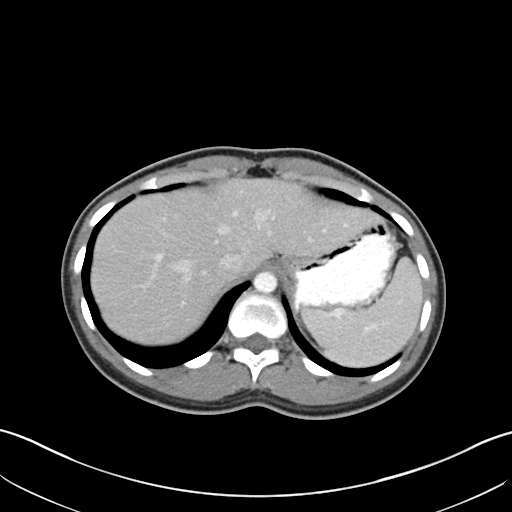
[im 75/86  lung]
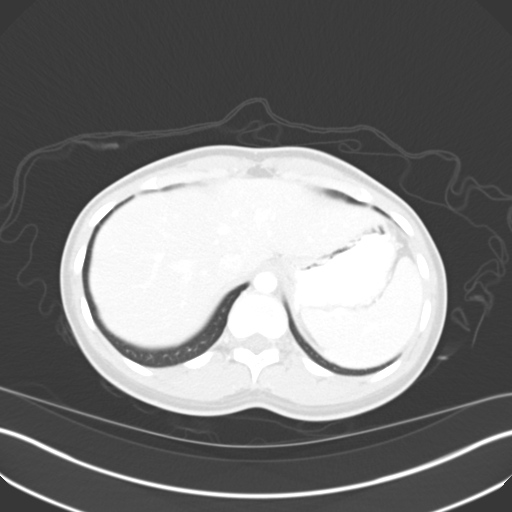
[im 78/86  lung]
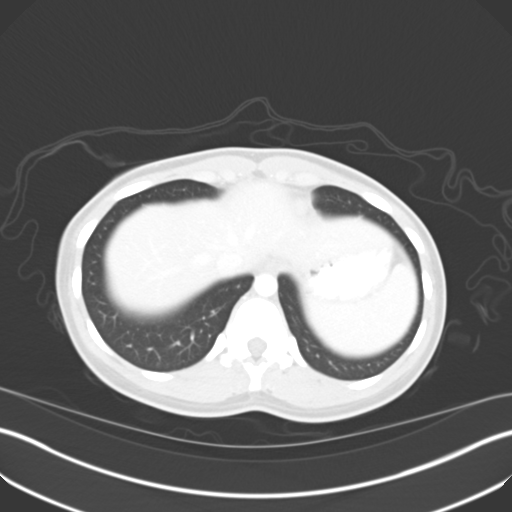
[im 82/86  soft-tissue]
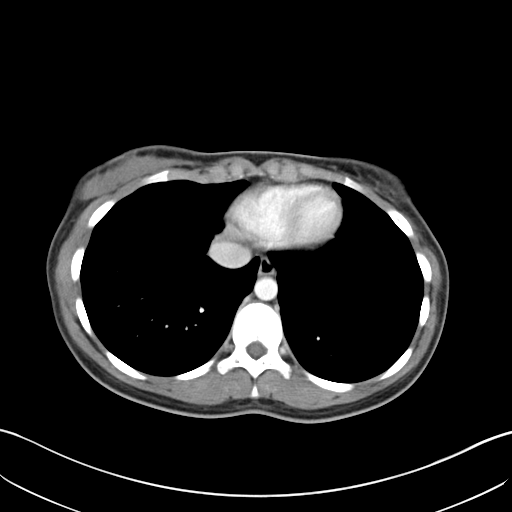
[im 82/86  lung]
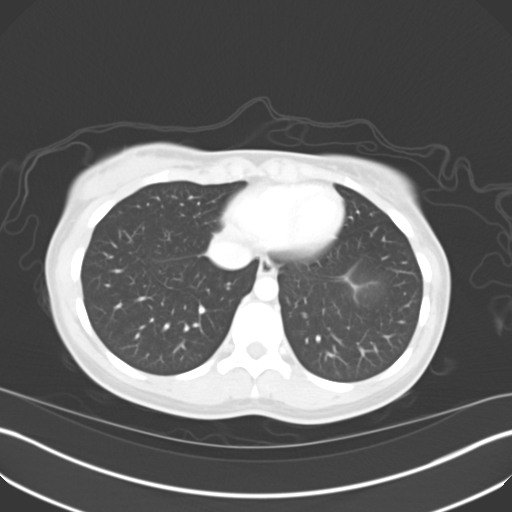

[15 of 32 positions shown; findings below may reference images not displayed]

FINDINGS: BODY WALL: Unremarkable.

LOWER CHEST: Unremarkable.

ABDOMEN/PELVIS:

Liver: No focal abnormality.

Biliary: No evidence of biliary obstruction or stone.

Pancreas: Unremarkable.

Spleen: Unremarkable.

Adrenals: Unremarkable.

Kidneys and ureters: No hydronephrosis or stone.

Bladder: Unremarkable.

Reproductive: Unremarkable.

Bowel: No obstruction. Normal appendix.

Retroperitoneum: No mass or adenopathy.

Peritoneum: Trace free pelvic fluid, usually physiologic.

Vascular: No acute abnormality.  Duplicated IVC.

OSSEOUS: No acute abnormalities.
IMPRESSION: Negative CT of the abdomen and pelvis.

## 2015-05-26 ENCOUNTER — Encounter (HOSPITAL_COMMUNITY): Payer: Self-pay

## 2015-05-26 ENCOUNTER — Emergency Department (HOSPITAL_COMMUNITY)
Admission: EM | Admit: 2015-05-26 | Discharge: 2015-05-26 | Disposition: A | Discharge status: Home / Self Care | Payer: Medicaid Other | Attending: Emergency Medicine | Admitting: Emergency Medicine

## 2015-05-26 DIAGNOSIS — Z79899 Other long term (current) drug therapy: Secondary | ICD-10-CM | POA: Insufficient documentation

## 2015-05-26 DIAGNOSIS — Z87440 Personal history of urinary (tract) infections: Secondary | ICD-10-CM | POA: Diagnosis not present

## 2015-05-26 DIAGNOSIS — Z87891 Personal history of nicotine dependence: Secondary | ICD-10-CM | POA: Insufficient documentation

## 2015-05-26 DIAGNOSIS — Z3202 Encounter for pregnancy test, result negative: Secondary | ICD-10-CM | POA: Insufficient documentation

## 2015-05-26 DIAGNOSIS — R1032 Left lower quadrant pain: Secondary | ICD-10-CM | POA: Diagnosis present

## 2015-05-26 DIAGNOSIS — N760 Acute vaginitis: Secondary | ICD-10-CM | POA: Diagnosis not present

## 2015-05-26 DIAGNOSIS — Z86718 Personal history of other venous thrombosis and embolism: Secondary | ICD-10-CM | POA: Insufficient documentation

## 2015-05-26 DIAGNOSIS — B9689 Other specified bacterial agents as the cause of diseases classified elsewhere: Secondary | ICD-10-CM

## 2015-05-26 DIAGNOSIS — Z8719 Personal history of other diseases of the digestive system: Secondary | ICD-10-CM | POA: Insufficient documentation

## 2015-05-26 LAB — WET PREP, GENITAL
Trich, Wet Prep: NONE SEEN
Yeast Wet Prep HPF POC: NONE SEEN

## 2015-05-26 LAB — PREGNANCY, URINE: Preg Test, Ur: NEGATIVE

## 2015-05-26 LAB — URINALYSIS, ROUTINE W REFLEX MICROSCOPIC
BILIRUBIN URINE: NEGATIVE
GLUCOSE, UA: NEGATIVE mg/dL
HGB URINE DIPSTICK: NEGATIVE
Ketones, ur: NEGATIVE mg/dL
Leukocytes, UA: NEGATIVE
Nitrite: NEGATIVE
PH: 6 (ref 5.0–8.0)
Protein, ur: NEGATIVE mg/dL
SPECIFIC GRAVITY, URINE: 1.029 (ref 1.005–1.030)
Urobilinogen, UA: 0.2 mg/dL (ref 0.0–1.0)

## 2015-05-26 LAB — RPR: RPR Ser Ql: NONREACTIVE

## 2015-05-26 LAB — HIV ANTIBODY (ROUTINE TESTING W REFLEX): HIV Screen 4th Generation wRfx: NONREACTIVE

## 2015-05-26 MED ORDER — IBUPROFEN 800 MG PO TABS
800.0000 mg | ORAL_TABLET | Freq: Once | ORAL | Status: AC
Start: 2015-05-26 — End: 2015-05-26
  Administered 2015-05-26: 800 mg via ORAL
  Filled 2015-05-26: qty 1

## 2015-05-26 MED ORDER — METRONIDAZOLE 500 MG PO TABS: 500.0000 mg | ORAL_TABLET | Freq: Two times a day (BID) | ORAL | Status: DC

## 2015-05-26 NOTE — ED Notes (Signed)
Pt complains of abdominal pain for two weeks, no trouble urinating but states she has discharge

## 2015-05-26 NOTE — Discharge Instructions (Signed)
Bacterial Vaginosis Bacterial vaginosis is a vaginal infection that occurs when the normal balance of bacteria in the vagina is disrupted. It results from an overgrowth of certain bacteria. This is the most common vaginal infection in women of childbearing age. Treatment is important to prevent complications, especially in pregnant women, as it can cause a premature delivery. CAUSES  Bacterial vaginosis is caused by an increase in harmful bacteria that are normally present in smaller amounts in the vagina. Several different kinds of bacteria can cause bacterial vaginosis. However, the reason that the condition develops is not fully understood. RISK FACTORS Certain activities or behaviors can put you at an increased risk of developing bacterial vaginosis, including:  Having a new sex partner or multiple sex partners.  Douching.  Using an intrauterine device (IUD) for contraception. Women do not get bacterial vaginosis from toilet seats, bedding, swimming pools, or contact with objects around them. SIGNS AND SYMPTOMS  Some women with bacterial vaginosis have no signs or symptoms. Common symptoms include:  Grey vaginal discharge.  A fishlike odor with discharge, especially after sexual intercourse.  Itching or burning of the vagina and vulva.  Burning or pain with urination. DIAGNOSIS  Your health care provider will take a medical history and examine the vagina for signs of bacterial vaginosis. A sample of vaginal fluid may be taken. Your health care provider will look at this sample under a microscope to check for bacteria and abnormal cells. A vaginal pH test may also be done.  TREATMENT  Bacterial vaginosis may be treated with antibiotic medicines. These may be given in the form of a pill or a vaginal cream. A second round of antibiotics may be prescribed if the condition comes back after treatment.  HOME CARE INSTRUCTIONS   Only take over-the-counter or prescription medicines as  directed by your health care provider.  If antibiotic medicine was prescribed, take it as directed. Make sure you finish it even if you start to feel better.  Do not have sex until treatment is completed.  Tell all sexual partners that you have a vaginal infection. They should see their health care provider and be treated if they have problems, such as a mild rash or itching.  Practice safe sex by using condoms and only having one sex partner. SEEK MEDICAL CARE IF:   Your symptoms are not improving after 3 days of treatment.  You have increased discharge or pain.  You have a fever. MAKE SURE YOU:   Understand these instructions.  Will watch your condition.  Will get help right away if you are not doing well or get worse. FOR MORE INFORMATION  Centers for Disease Control and Prevention, Division of STD Prevention: www.cdc.gov/std American Sexual Health Association (ASHA): www.ashastd.org  Document Released: 11/06/2005 Document Revised: 08/27/2013 Document Reviewed: 06/18/2013 ExitCare Patient Information 2015 [employer]. This information is not intended to replace advice given to you by your health care provider. Make sure you discuss any questions you have with your health care provider.  

## 2015-05-26 NOTE — ED Provider Notes (Signed)
TIME SEEN: 3:30 AM  CHIEF COMPLAINT: Abdominal pain, vaginal discharge  HPI: Patient is a 24 y.o. G1 P1 who presents to the emergency department with 2 weeks of left-sided lower abdominal pain, vaginal discharge and a "hot" sensation in her vagina. She is sexually active with one female partner and has had sexual intercourse recently without protection. Describes her vaginal discharge is "chunky and creamy". No odor, itching or burning. No dysuria or hematuria. No abnormal vaginal bleeding. Last menstrual period was 2 and half weeks ago. Denies fevers, chills, nausea, vomiting or diarrhea. No prior history of STDs.  ROS: See HPI Constitutional: no fever  Eyes: no drainage  ENT: no runny nose   Cardiovascular:  no chest pain  Resp: no SOB  GI: no vomiting GU: no dysuria Integumentary: no rash  Allergy: no hives  Musculoskeletal: no leg swelling  Neurological: no slurred speech ROS otherwise negative  PAST MEDICAL HISTORY/PAST SURGICAL HISTORY:  Past Medical History  Diagnosis Date  . Blood transfusion   . Hx of blood clots   . H/O bladder infections   . Post partum depression   . Anorectal fissure   . Cervicitis     MEDICATIONS:  Prior to Admission medications   Medication Sig Start Date End Date Taking? Authorizing Provider  ketoconazole (NIZORAL) 2 % cream Apply 1 application topically daily.    Historical Provider, MD  pimecrolimus (ELIDEL) 1 % cream Apply 1 application topically 2 (two) times daily.    Historical Provider, MD    ALLERGIES:  Allergies  Allergen Reactions  . Fish Allergy Hives  . Strawberry Hives and Itching    SOCIAL HISTORY:  History  Substance Use Topics  . Smoking status: Former Games developer  . Smokeless tobacco: Not on file  . Alcohol Use: Yes     Comment: occasional    FAMILY HISTORY: Family History  Problem Relation Age of Onset  . Cancer Maternal Grandfather   . Migraines Mother   . Gallbladder disease Mother   . Nephrolithiasis Mother    . Asthma Mother   . Hernia Brother   . Asthma Daughter   . Cancer Maternal Aunt     EXAM: BP 114/66 mmHg  Pulse 67  Temp(Src) 97.6 F (36.4 C) (Oral)  Resp 18  Ht  (1.753 m)  Wt 154 lb (69.854 kg)  BMI 22.73 kg/m2  SpO2 100%  LMP 05/12/2015 CONSTITUTIONAL: Alert and oriented and responds appropriately to questions. Well-appearing; well-nourished, afebrile, nontoxic, no distress HEAD: Normocephalic EYES: Conjunctivae clear, PERRL ENT: normal nose; no rhinorrhea; moist mucous membranes; pharynx without lesions noted NECK: Supple, no meningismus, no LAD  CARD: RRR; S1 and S2 appreciated; no murmurs, no clicks, no rubs, no gallops RESP: Normal chest excursion without splinting or tachypnea; breath sounds clear and equal bilaterally; no wheezes, no rhonchi, no rales, no hypoxia or respiratory distress, speaking full sentences ABD/GI: Normal bowel sounds; non-distended; soft, non-tender, no rebound, no guarding, no peritoneal signs GU:  Normal external genitalia, patient has small amount of white/yellow vaginal discharge without odor, no vaginal bleeding, no adnexal tenderness or fullness, no cervical motion tenderness BACK:  The back appears normal and is non-tender to palpation, there is no CVA tenderness EXT: Normal ROM in all joints; non-tender to palpation; no edema; normal capillary refill; no cyanosis, no calf tenderness or swelling    SKIN: Normal color for age and race; warm NEURO: Moves all extremities equally, sensation to light touch intact diffusely, cranial nerves II through XII intact  PSYCH: The patient's mood and manner are appropriate. Grooming and personal hygiene are appropriate.  MEDICAL DECISION MAKING: Patient here with vaginal discharge and abdominal pain. Her pelvic exam is unremarkable and her abdominal exam is completely benign. Urine shows no sign of infection in urine pregnancy is negative. She is positive for clue cells and given she is having discharge  and abdominal pain will treat with Flagyl. STD screening is pending but I do not feel she needs empiric treatment at this time. Discussed return precautions and supportive care instructions. She verbalized understanding and is comfortable with plan.       Stacey MawKristen N Matai Carpenito, DO 05/26/15 [phone]

## 2015-05-26 NOTE — ED Notes (Signed)
Pt states that she started having symptoms after having sex with her boyfriend

## 2015-05-27 LAB — GC/CHLAMYDIA PROBE AMP (~~LOC~~) NOT AT ARMC
Chlamydia: NEGATIVE
Neisseria Gonorrhea: NEGATIVE

## 2016-03-22 ENCOUNTER — Ambulatory Visit (HOSPITAL_COMMUNITY)
Admission: EM | Admit: 2016-03-22 | Discharge: 2016-03-22 | Disposition: A | Discharge status: Home / Self Care | Payer: Medicaid Other | Attending: Family Medicine | Admitting: Family Medicine

## 2016-03-22 ENCOUNTER — Encounter (HOSPITAL_COMMUNITY): Payer: Self-pay | Admitting: Emergency Medicine

## 2016-03-22 DIAGNOSIS — Z87891 Personal history of nicotine dependence: Secondary | ICD-10-CM | POA: Diagnosis not present

## 2016-03-22 DIAGNOSIS — N898 Other specified noninflammatory disorders of vagina: Secondary | ICD-10-CM

## 2016-03-22 DIAGNOSIS — R102 Pelvic and perineal pain: Secondary | ICD-10-CM | POA: Insufficient documentation

## 2016-03-22 LAB — POCT URINALYSIS DIP (DEVICE)
Glucose, UA: NEGATIVE mg/dL
LEUKOCYTES UA: NEGATIVE
Nitrite: NEGATIVE
Protein, ur: NEGATIVE mg/dL
Specific Gravity, Urine: >=1.03 (ref 1.005–1.030)
Urobilinogen, UA: 1 mg/dL (ref 0.0–1.0)
pH: 6 (ref 5.0–8.0)

## 2016-03-22 LAB — POCT PREGNANCY, URINE: Preg Test, Ur: NEGATIVE

## 2016-03-22 MED ORDER — METRONIDAZOLE 500 MG PO TABS: 500.0000 mg | ORAL_TABLET | Freq: Two times a day (BID) | ORAL | Status: DC

## 2016-03-22 NOTE — ED Notes (Signed)
Pt has been having some pelvic pressure with vaginal discharge and itching for one week.  She reports recurrent yeast infections, but she doesn't usually have the pelvic pressure that has not gone away since her menstrual cycle one week ago.  She states she has a monogamous partner of six years and had unprotected sex with her partner one week ago.

## 2016-03-22 NOTE — Discharge Instructions (Signed)
It is a pleasure to see you today in the Urgent Care Center. The urinalysis does not look like a urinary tract infection.  Your urine pregnancy test is negative today.   I suspect your discharge and pelvic discomfort is caused by a condition called bacterial vaginosis. For this, I am treating you with an oral medication, METRONIDAZOLE 500mg , take 1 tablet by mouth twice daily for seven days.  Do not drink alcohol during the time of treatment, as it reacts with the medicine and may cause severe stomach upset and nausea/vomiting.   No sexual intercourse until results of studies are back, and you are better.  You should expect a call in a few days with the results of the studies we sent out today.    Follow up in Urgent Care or with your doctor if worsening, if fevers/chills, nausea/vomiting, worse pain, or other concerns.

## 2016-03-22 NOTE — ED Provider Notes (Addendum)
CSN: [id number]     Arrival date & time 03/22/16  1432 History   First MD Initiated Contact with Patient 03/22/16 1650     Chief Complaint  Patient presents with  . Vaginal Discharge  . Pelvic Pain   (Consider location/radiation/quality/duration/timing/severity/associated sxs/prior Treatment) Patient is a 25 y.o. female presenting with vaginal discharge and pelvic pain. The history is provided by the patient. No language interpreter was used.  Vaginal Discharge Associated symptoms: nausea   Associated symptoms: no abdominal pain, no dyspareunia, no dysuria, no fever and no vomiting   Pelvic Pain Pertinent negatives include no abdominal pain.  Presents with complaint of pelvic pressure, worse L than R, for the past 2 weeks. Accompanied by creamy thick yellow discharge that is malodorous. LMP was one week ago. Has history of yeast infections but says this is different than her usual yeast.  ROS reveals urinary frequency and "strange feeling" with void that is not painful.  No urgency. No fevers or chills, no vomiting but some nausea. No diarrhea or constipation. Did have a rash on pelvic area but thought was from shaving, largely resolved now.   Sexually active with same female partner for the past 6 years, she reports he has not had discharge or rashes.   PMHx no prior STI.  No history HSV2.    Past Medical History  Diagnosis Date  . Blood transfusion   . Hx of blood clots   . H/O bladder infections   . Post partum depression   . Anorectal fissure   . Cervicitis    Past Surgical History  Procedure Laterality Date  . Wisdom tooth extraction     Family History  Problem Relation Age of Onset  . Cancer Maternal Grandfather   . Migraines Mother   . Gallbladder disease Mother   . Nephrolithiasis Mother   . Asthma Mother   . Hernia Brother   . Asthma Daughter   . Cancer Maternal Aunt    Social History  Substance Use Topics  . Smoking status: Former Games developer  . Smokeless tobacco:  None  . Alcohol Use: Yes     Comment: occasional   OB History    Gravida Para Term Preterm AB TAB SAB Ectopic Multiple Living   Review of Systems  Constitutional: Negative for fever, chills and diaphoresis.  Gastrointestinal: Positive for nausea. Negative for vomiting, abdominal pain, diarrhea and anal bleeding.  Genitourinary: Positive for frequency, vaginal discharge and pelvic pain. Negative for dysuria, urgency, vaginal bleeding, vaginal pain and dyspareunia.  All other systems reviewed and are negative.   Allergies  Fish allergy and Strawberry extract  Home Medications   Prior to Admission medications   Medication Sig Start Date End Date Taking? Authorizing Provider  Aspirin-Salicylamide-Caffeine (BC HEADACHE POWDER PO) Take 1 packet by mouth every 4 (four) hours as needed (pain).    Historical Provider, MD  ketoconazole (NIZORAL) 2 % cream Apply 1 application topically daily.    Historical Provider, MD  metroNIDAZOLE (FLAGYL) 500 MG tablet Take 1 tablet (500 mg total) by mouth 2 (two) times daily. Do not drink alcohol with this medication. 05/26/15   Kristen N Ward, DO  PAZEO 0.7 % SOLN Place 1 drop into both eyes daily. 05/16/15   Historical Provider, MD  pimecrolimus (ELIDEL) 1 % cream Apply 1 application topically 2 (two) times daily.    Historical Provider, MD   Meds Ordered and Administered  this Visit  Medications - No data to display  BP 118/67 mmHg  Pulse 67  Temp(Src) 98.1 F (36.7 C) (Oral)  Resp 16  Ht 5\' 9"  (1.753 m)  Wt 158 lb (71.668 kg)  BMI 23.32 kg/m2  SpO2 100%  LMP 03/10/2016 (Within Days) No data found.   Physical Exam  Constitutional: She appears well-developed and well-nourished. No distress.  Neck: Neck supple.  Abdominal: Soft. She exhibits no distension and no mass. There is no tenderness. There is no rebound and no guarding.  Genitourinary: Vagina normal.  No skin changes, normal vaginal mucosa.   Thick white discharge  in fornix. No purulence from cervical os.  Bimanual exam without CMT.  Mild discomfort in L adnexa without adnexal masses bilaterally. No suprapubic tenderness.   Skin: She is not diaphoretic.    ED Course  Procedures (including critical care time)  Labs Review Labs Reviewed  POCT URINALYSIS DIP (DEVICE) - Abnormal; Notable for the following:    Bilirubin Urine SMALL (*)    Ketones, ur TRACE (*)    Hgb urine dipstick SMALL (*)    All other components within normal limits  HERPES SIMPLEX VIRUS CULTURE  POCT PREGNANCY, URINE  CERVICOVAGINAL ANCILLARY ONLY    Imaging Review No results found.   Visual Acuity Review  Right Eye Distance:   Left Eye Distance:   Bilateral Distance:    Right Eye Near:   Left Eye Near:    Bilateral Near:         MDM   1. Vaginal discharge    Pelvic discomfort and malodorous discharge, no CMT on exam. Upreg negative, UA dipstick with small Hgb but negative nitrites/leuks.  Low likelihood for cervicitis, suspect BV as cause of malodorous discharge. Will opt for empirical tx with metronidazole orally for 7 days.  If studies collected today show otherwise, to adjust treatment plan accordingly.     Barbaraann BarthelJames O Torsten Weniger, MD 03/22/16 1705  Barbaraann BarthelJames O Yanai Hobson, MD 03/22/16 [phone]

## 2016-03-23 LAB — CERVICOVAGINAL ANCILLARY ONLY
Chlamydia: NEGATIVE
Neisseria Gonorrhea: NEGATIVE

## 2016-03-25 LAB — CERVICOVAGINAL ANCILLARY ONLY: WET PREP (BD AFFIRM): NEGATIVE

## 2016-03-27 ENCOUNTER — Telehealth (HOSPITAL_COMMUNITY): Payer: Self-pay | Admitting: Emergency Medicine

## 2016-03-27 NOTE — ED Notes (Signed)
Returned pt's call who needs lab results from 5/3  VM was full and was unable to LM.  Per Dr. Dayton ScrapeMurray,   Notes Recorded by Eustace MooreLaura W Murray, MD on 03/27/2016 at 8:17 AM Tests for candida (yeast), gardnerella (bacterial vaginosis), and trichomonas were also negative.  Recheck as needed. Result note copied to patient's MyChart. Ria ClockLaura Murray MD Notes Recorded by Eustace MooreLaura W Murray, MD on 03/23/2016 at 11:09 PM Tests for gonorrhea/chlamydia were negative. Recheck for further evaluation as needed.  Result note copied to patient's MyChart. Ria ClockLaura Murray MD

## 2016-03-28 NOTE — ED Notes (Signed)
Patient called requesting lab results. Informed her all test negative. She reports she is feeling better with Flagyl treatment.

## 2017-01-24 ENCOUNTER — Ambulatory Visit (INDEPENDENT_AMBULATORY_CARE_PROVIDER_SITE_OTHER): Payer: Medicaid Other | Admitting: Neurology

## 2017-01-24 ENCOUNTER — Encounter: Payer: Self-pay | Admitting: Neurology

## 2017-01-24 ENCOUNTER — Institutional Professional Consult (permissible substitution): Payer: Medicaid Other | Admitting: Neurology

## 2017-01-24 VITALS — BP 102/68 | HR 60 | Resp 14 | Ht 69.0 in | Wt 170.0 lb

## 2017-01-24 DIAGNOSIS — G4761 Periodic limb movement disorder: Secondary | ICD-10-CM | POA: Diagnosis not present

## 2017-01-24 DIAGNOSIS — R0683 Snoring: Secondary | ICD-10-CM

## 2017-01-24 DIAGNOSIS — R51 Headache: Secondary | ICD-10-CM | POA: Diagnosis not present

## 2017-01-24 DIAGNOSIS — R400 Somnolence: Secondary | ICD-10-CM | POA: Diagnosis not present

## 2017-01-24 DIAGNOSIS — R519 Headache, unspecified: Secondary | ICD-10-CM

## 2017-01-24 DIAGNOSIS — G4710 Hypersomnia, unspecified: Secondary | ICD-10-CM | POA: Diagnosis not present

## 2017-01-24 DIAGNOSIS — G478 Other sleep disorders: Secondary | ICD-10-CM

## 2017-01-24 DIAGNOSIS — G2581 Restless legs syndrome: Secondary | ICD-10-CM
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
## 2017-01-24 NOTE — Patient Instructions (Signed)
I believe you may have an underlying sleepiness condition. This means, that you have a sleep disorder that manifests with excessive sleep and excessive sleepiness during the day.  We will do sleep study testing at this point: I would like for you to come back for an overnight sleep study during which we will monitor your night time sleep and we will do nap study testing the next day: 5 scheduled 20 min nap opportunities, every 2 hours. We will remind you to stay awake in between naps.   As explained, you will have to limit your caffeine to about 3-4 servings per day (coffee or soda). You have to be off of any stimulant or antidepressant medication in preparation for the sleep studies.

## 2017-02-11 ENCOUNTER — Ambulatory Visit (INDEPENDENT_AMBULATORY_CARE_PROVIDER_SITE_OTHER): Payer: Medicaid Other | Admitting: Neurology

## 2017-02-11 DIAGNOSIS — G4720 Circadian rhythm sleep disorder, unspecified type: Secondary | ICD-10-CM

## 2017-02-11 DIAGNOSIS — G4761 Periodic limb movement disorder: Secondary | ICD-10-CM

## 2017-02-11 DIAGNOSIS — G4710 Hypersomnia, unspecified: Secondary | ICD-10-CM | POA: Diagnosis not present

## 2017-02-11 DIAGNOSIS — G4719 Other hypersomnia: Secondary | ICD-10-CM

## 2017-02-12 ENCOUNTER — Ambulatory Visit (INDEPENDENT_AMBULATORY_CARE_PROVIDER_SITE_OTHER): Payer: Medicaid Other | Admitting: Neurology

## 2017-02-12 DIAGNOSIS — G47429 Narcolepsy in conditions classified elsewhere without cataplexy: Secondary | ICD-10-CM | POA: Diagnosis not present

## 2017-02-12 DIAGNOSIS — G478 Other sleep disorders: Secondary | ICD-10-CM

## 2017-02-12 DIAGNOSIS — R51 Headache: Secondary | ICD-10-CM

## 2017-02-12 DIAGNOSIS — R519 Headache, unspecified: Secondary | ICD-10-CM

## 2017-02-12 DIAGNOSIS — R400 Somnolence: Secondary | ICD-10-CM

## 2017-02-12 DIAGNOSIS — G4710 Hypersomnia, unspecified: Secondary | ICD-10-CM

## 2017-02-12 DIAGNOSIS — G4761 Periodic limb movement disorder: Secondary | ICD-10-CM

## 2017-02-12 DIAGNOSIS — R0683 Snoring: Secondary | ICD-10-CM

## 2017-02-12 DIAGNOSIS — G2581 Restless legs syndrome: Secondary | ICD-10-CM

## 2017-02-14 NOTE — Progress Notes (Signed)
Patient referred by NP, Ms. Montoya, seen by me on 01/24/17, diagnostic PSG on 02/11/17, MSLT on 02/12/17.   Please call and notify the patient that the recent sleep studies did not show any significant obstructive sleep apnea or leg twitching and mild sleepiness, not in keeping with narcolepsy or idiopathic hypersomnolence. Please inform patient that I would like to go over the details of the study during a follow up appointment. Arrange a followup appointment. Also, route or fax report to PCP and referring MD, if other than PCP.  Once you have spoken to patient, you can close this encounter.   Thanks,  Huston FoleySaima Daurice Ovando, MD, PhD [facility]

## 2017-02-14 NOTE — Procedures (Signed)
Name:  Stacey Mann, Stacey Mann Reference [id number]  Study Date: 02/12/2017 Procedure #: 636  DOB: [birth year]    HISTORY: 26 year old woman with a history of bacterial vaginosis, recurrent UTI, history of blood clot, who reports excessive daytime somnolence and nonrestorative sleep, despite getting adequate sleep. She has a family history of narcolepsy in her sister, who is 33 years old and was recently diagnosed. Her Epworth sleepiness score is 23 out of 24, fatigue score is 61 out of 63. The patient's weight 170 pounds with a height of 69 (inches), resulting in a BMI of 25.1 kg/m2. The patient's neck circumference measured 14 inches.  Protocol  This is a 13 channel Multiple Sleep Latency Test comprised of 5 channels of EEG (T3-Cz, Cz-T4, F4-M1, C4-M1, O2-M1), 3 channels of Chin EMG, 4 channels of EOG and 1 channel for ECG.   All channels were sampled at 256hz .    This polysomnographic procedure is designed to evaluate (1) the complaint of excessive daytime sleepiness by quantifying the time required to fall asleep and (2) the possibility of narcolepsy by checking for abnormally short latencies to REM sleep.  Electrographic variables include EEG, EMG, EOG and ECG.  Patients are monitored throughout four or five 20-minute opportunities to sleep (naps) at two-hour intervals.  For each nap, the patient is allowed 20 minutes to fall asleep.  Once asleep, the patient is awakened after 15 minutes.  Between naps, the patient is kept as alert as possible.  A sleep latency of 20 minutes indicates that no sleep occurred.  Parametric Analysis  Total Number of Naps 5     NAP # Time of Nap  Sleep Latency (mins) REM Latency (mins) Sleep Time Percent Awake Time Percent  1 07:31 20 0 0 100   2 09:27 19.5 0 43  57   3 11:24 4.5 0 64  36   4 13:26 6 0 69  31   5 15:23 7 0 68  32    MSLT Summary of Naps  Sleepiness Index: 43  Mean Sleep Latency to all Five Naps: 11.4  Mean Sleep Latency to First Four  Naps: 12.5  Mean Sleep Latency to First Three Naps: 14.7  Mean Sleep Latency to First Two Naps: 19.8  Number of Naps with REM Sleep: 0    Results from Preceding PSG Study  Sleep Onset Time 2253 Sleep Efficiency (%) 85.2  Rise Time 0600 Sleep Latency (min) 17  Total Sleep Time  378.5 REM Latency (min) 98    I attest to having reviewed every epoch of the entire raw data recording prior to the issuance of this report in accordance with the Standards of the American Academy of Sleep Medicine.    IMPRESSION: Hypersomnia, unspecified  1. This multiple sleep latency test reveals a mean sleep latency of 11.4 minutes with 0 sleep periods during which REM sleep was recorded.  A total of 5 sleep periods were recorded.   2. This study was preceded by an overnight polysomnogram with a total sleep time (TST) of 378.5 minutes.       Name:  Stacey Mann, Stacey Mann Reference #:  [id number]  Study Date: 02/12/2017 DOB: 1991/04/06    RECOMMENDATIONS:  1. The nocturnal PSG and next day MSLT indicate mild daytime somnolence, but in the context of only slightly more than 6 hours of total sleep time and sleep fragmentation, mildly decreased sleep efficiency during the night, these studies do not clearly support the diagnosis of idiopathic hypersomnolence or narcolepsy.  2. The  next day MSLT on 02/12/17 showed a mean sleep latency of 11.4 min for 5 naps and no sleep onset REM. This indicates mild sleepiness, but not in keeping with narcolepsy or idiopathic hypersomnolence and in the context of a preceding nighttime sleep study with barely over 6 hours of total sleep time, may indicate, requirement for more nighttime sleep to feel rested; clinical correlation is recommended.  3. The nocturnal study shows mild sleep fragmentation and mildly abnormal sleep stage percentages; these are nonspecific findings and per se do not signify an intrinsic sleep disorder or a cause for the patient's sleep-related symptoms. Causes include  (but are not limited to) the first night effect of the sleep study, circadian rhythm disturbances, medication effect or an underlying mood disorder or medical problem.  4. The patient should be cautioned not to drive, work at heights, or operate dangerous or heavy equipment when tired or sleepy. Review and reiteration of good sleep hygiene measures should be pursued with any patient. 5. The patient will be seen in follow-up by Dr[facility] Frances [facility]urb[facility]Athar at [facility] for discussion of the test results and further management strategies. The referring provider will be notified of the test results.  I certify that I have reviewed the entire raw data recording prior to the issuance of this report in accordance with the Standards of Accreditation of the American Academy of Sleep Medicine (AASM)   Stacey FoleySaima Reshawn Ostlund, MD, PhD Diplomat, American Board of Psychiatry and Neurology (Neurology and Sleep Medicine)

## 2017-02-14 NOTE — Progress Notes (Signed)
See result not for PSG. Huston FoleySaima Perris Conwell, MD, PhD [facility])

## 2017-02-14 NOTE — Procedures (Signed)
PATIENT'S NAME:  Stacey Mann, Stacey Mann DOB:      [birth year]      MR#:    [id number]     DATE OF RECORDING: 02/11/2017 REFERRING M.D.:  Felipe Drone, NP Study Performed:   Baseline Polysomnogram HISTORY: 26 year old woman with a history of bacterial vaginosis, recurrent UTI, history of blood clot, who reports excessive daytime somnolence and nonrestorative sleep, despite getting adequate sleep. She has a family history of narcolepsy in her sister, who is 30 years old and was recently diagnosed. Her Epworth sleepiness score is 23 out of 24, fatigue score is 61 out of 63. The patient's weight 170 pounds with a height of 69 (inches), resulting in a BMI of 25.1 kg/m2. The patient's neck circumference measured 14 inches.  CURRENT MEDICATIONS: Pazeo eye drops,   PROCEDURE:  This is a multichannel digital polysomnogram utilizing the Somnostar 11.2 system.  Electrodes and sensors were applied and monitored per AASM Specifications.   EEG, EOG, Chin and Limb EMG, were sampled at 200 Hz.  ECG, Snore and Nasal Pressure, Thermal Airflow, Respiratory Effort, CPAP Flow and Pressure, Oximetry was sampled at 50 Hz. Digital video and audio were recorded.      BASELINE STUDY This study was followed the next day by a nap study. She did not achieve the required 6 h or TST during the NPSG until 6 AM.   Lights Out was at 22:36 and Lights On at 06:00.  Total recording time (TRT) was 444 minutes, with a total sleep time (TST) of  378.5 minutes.   The patient's sleep latency was 35 minutes, which is prolonged.  REM latency was 98 minutes, which is normal.  The sleep efficiency was 85.2 %.     SLEEP ARCHITECTURE: WASO (Wake after sleep onset) was 48.5 minutes with overall mild sleep fragmentation noted and one longer period of wakefulness.  There were 51.5 minutes in Stage N1, 156.5 minutes Stage N2, 78 minutes Stage N3 and 92.5 minutes in Stage REM.  The percentage of Stage N1 was 13.6%, which is markedly increased, Stage N2  was 41.3%, which is mildly reduced, Stage N3 was 20.6%, which is normal and Stage R (REM sleep) was 24.4%, which is normal.   The arousals were noted as: 31 were spontaneous, 0 were associated with PLMs, 0 were associated with respiratory events.    Audio and video analysis did not show any abnormal or unusual movements, behaviors, phonations or vocalizations.  The patient took 1 bathroom break. No significant snoring was noted. The EKG was in keeping with normal sinus rhythm (NSR).  RESPIRATORY ANALYSIS:  There were a total of 0 respiratory events:  0 obstructive apneas, 0 central apneas and 0 mixed apneas with a total of 0 apneas and an apnea index (AI) of 0 /hour. There were 0 hypopneas with a hypopnea index of 0 /hour. The patient also had 0 respiratory event related arousals (RERAs).      The total APNEA/HYPOPNEA INDEX (AHI) was 0/hour and the total RESPIRATORY DISTURBANCE INDEX was 0 /hour.  0 events occurred in REM sleep and 0 events in NREM. The REM AHI was 0 /hour, versus a non-REM AHI of 0. The patient spent 56.5 minutes of total sleep time in the supine position and 322 minutes in non-supine.. The supine AHI was 0.0 versus a non-supine AHI of 0.0.  OXYGEN SATURATION & C02:  The Wake baseline 02 saturation was 98%, with the lowest being 84% (error, due to loss of signal, true nadir appears to  be 90%). Time spent below 89% saturation equaled 56 minutes (error, due to loss of signal and faulty signal).  PERIODIC LIMB MOVEMENTS: The patient had a total of 0 Periodic Limb Movements.  The Periodic Limb Movement (PLM) index was 0 and the PLM Arousal index was 0/hour.    Post-study, the patient indicated that sleep was the same as usual.   The next day MSLT on 02/12/17 showed a mean sleep latency of 11.4 min for 5 naps and no sleep onset REM. This indicates mild sleepiness, but not in keeping with narcolepsy or idiopathic hypersomnolence and in the context of a preceding nighttime sleep study with  barely over 6 hours of total sleep time, may indicate, requirement for more nighttime sleep to feel rested; clinical correlation is recommended.   IMPRESSION:  1. Hypersomnia, unspecified 2. Dysfunctions associated with sleep stages or arousal from sleep  RECOMMENDATIONS:  1. This study does not demonstrate any significant obstructive or central sleep disordered breathing, no significant snoring or significant oxygen desaturations. 2. The nocturnal PSG and next day MSLT indicate mild daytime somnolence, but in the context of only slightly more than 6 hours of total sleep time and sleep fragmentation, mildly decreased sleep efficiency during the night, these studies do not clearly support the diagnosis of idiopathic hypersomnolence or narcolepsy.  3. The next day MSLT on 02/12/17 showed a mean sleep latency of 11.4 min for 5 naps and no sleep onset REM. This indicates mild sleepiness, but not in keeping with narcolepsy or idiopathic hypersomnolence and in the context of a preceding nighttime sleep study with barely over 6 hours of total sleep time, may indicate, requirement for more nighttime sleep to feel rested; clinical correlation is recommended.  4. No PLMs (periodic limb movements of sleep) were noted during the nighttime sleep study.  5. The nocturnal study shows mild sleep fragmentation and mildly abnormal sleep stage percentages; these are nonspecific findings and per se do not signify an intrinsic sleep disorder or a cause for the patient's sleep-related symptoms. Causes include (but are not limited to) the first night effect of the sleep study, circadian rhythm disturbances, medication effect or an underlying mood disorder or medical problem.  6. The patient should be cautioned not to drive, work at heights, or operate dangerous or heavy equipment when tired or sleepy. Review and reiteration of good sleep hygiene measures should be pursued with any patient. 7. The patient will be seen in  follow[facility] for discussion of the test results and further management strategies. The referring provider will be notified of the test results.  I certify that I have reviewed the entire raw data recording prior to the issuance of this report in accordance with the Standards of Accreditation of the American Academy of Sleep Medicine (AASM)   Huston FoleySaima Kavish Lafitte, MD, PhD Diplomat, American Board of Psychiatry and Neurology (Neurology and Sleep Medicine)

## 2017-02-15 ENCOUNTER — Telehealth: Payer: Self-pay

## 2017-02-15 NOTE — Telephone Encounter (Signed)
-----   Message from Huston FoleySaima Athar, MD sent at 02/14/2017  8:38 AM EDT ----- Patient referred by NP, Ms. Montoya, seen by me on 01/24/17, diagnostic PSG on 02/11/17, MSLT on 02/12/17.   Please call and notify the patient that the recent sleep studies did not show any significant obstructive sleep apnea or leg twitching and mild sleepiness, not in keeping with narcolepsy or idiopathic hypersomnolence. Please inform patient that I would like to go over the details of the study during a follow up appointment. Arrange a followup appointment. Also, route or fax report to PCP and referring MD, if other than PCP.  Once you have spoken to patient, you can close this encounter.   Thanks,  Huston FoleySaima Athar, MD, PhD [facility]

## 2017-02-15 NOTE — Telephone Encounter (Signed)
I spoke to patient and she is aware of results and recommendations. She was able to make f/u appt in June, I will call her if something sooner opens.

## 2017-02-15 NOTE — Telephone Encounter (Signed)
-----   Message from Huston FoleySaima Athar, MD sent at 02/14/2017  8:38 AM EDT ----- See result not for PSG. Huston FoleySaima Athar, MD, PhD [facility])

## 2017-02-20 NOTE — Telephone Encounter (Signed)
I called to offer an appt on Monday, there was no answer

## 2017-02-20 NOTE — Telephone Encounter (Signed)
Patient called office returning call.  Please call °

## 2017-02-22 NOTE — Telephone Encounter (Signed)
LM for patient to call back. She needs f/u appt. I stated that as of right now there is an open appt on 4/10 at 10:30. I asked her to call back and let us know if she can do that (if appt still open). I stated that if it is not still open, we still need to get her scheduled for a f/u appt.

## 2017-05-10 ENCOUNTER — Ambulatory Visit: Payer: Self-pay | Admitting: Neurology

## 2017-05-14 ENCOUNTER — Encounter: Payer: Self-pay | Admitting: Neurology

## 2017-05-30 ENCOUNTER — Emergency Department (HOSPITAL_COMMUNITY)
Admission: EM | Admit: 2017-05-30 | Discharge: 2017-05-30 | Disposition: A | Discharge status: Home / Self Care | Payer: Medicaid Other | Attending: Emergency Medicine | Admitting: Emergency Medicine

## 2017-05-30 ENCOUNTER — Encounter (HOSPITAL_COMMUNITY): Payer: Self-pay | Admitting: Emergency Medicine

## 2017-05-30 DIAGNOSIS — Z23 Encounter for immunization: Secondary | ICD-10-CM | POA: Diagnosis not present

## 2017-05-30 DIAGNOSIS — L988 Other specified disorders of the skin and subcutaneous tissue: Secondary | ICD-10-CM | POA: Diagnosis present

## 2017-05-30 DIAGNOSIS — L02412 Cutaneous abscess of left axilla: Secondary | ICD-10-CM

## 2017-05-30 DIAGNOSIS — N764 Abscess of vulva: Secondary | ICD-10-CM | POA: Diagnosis not present

## 2017-05-30 DIAGNOSIS — L0291 Cutaneous abscess, unspecified: Secondary | ICD-10-CM

## 2017-05-30 MED ORDER — LIDOCAINE-EPINEPHRINE (PF) 2 %-1:200000 IJ SOLN
20.0000 mL | Freq: Once | INTRAMUSCULAR | Status: AC
  Administered 2017-05-30: 20 mL
  Filled 2017-05-30: qty 20

## 2017-05-30 MED ORDER — HYDROCODONE-ACETAMINOPHEN 5-325 MG PO TABS: 1.0000 | ORAL_TABLET | Freq: Four times a day (QID) | ORAL | 0 refills | Status: DC | PRN

## 2017-05-30 MED ORDER — TETANUS-DIPHTH-ACELL PERTUSSIS 5-2.5-18.5 LF-MCG/0.5 IM SUSP
0.5000 mL | Freq: Once | INTRAMUSCULAR | Status: AC
  Administered 2017-05-30: 0.5 mL via INTRAMUSCULAR
  Filled 2017-05-30: qty 0.5

## 2017-05-30 MED ORDER — SULFAMETHOXAZOLE-TRIMETHOPRIM 800-160 MG PO TABS: 1.0000 | ORAL_TABLET | Freq: Two times a day (BID) | ORAL | 0 refills | Status: DC

## 2017-05-30 NOTE — ED Provider Notes (Signed)
[facility]-EMERGENCY DEPT Provider Note   CSN: [id number] Arrival date & time: 05/30/17  1519     History   Chief Complaint Chief Complaint  Patient presents with  . Abscess    HPI Stacey Mann is a 26 y.o. female who presents today for evaluation of boils to her left axilla, right labia and suprapubic area. She reports that these have all been present for or 1-2 weeks. She has been attempting to treat them at home on her own with warm compresses and attempting to express material which has resolved some boil in her suprapubic area, but not the one in her left axilla and on her right labia. She reports that she regularly shaves these areas "all the time" however has not had symptoms like this before. She denies any fevers/chills. No nausea/vomiting.  She reports that the abscess under her left arm has "popped" today and is draining a scant amount of bloody, foul smelling drainage.  She reports that the boil on her right labia has been growing, and she has not had any drainage from it.    HPI  Past Medical History:  Diagnosis Date  . Anorectal fissure   . Blood transfusion   . Cervicitis   . H/O bladder infections   . Hx of blood clots   . Post partum depression     There are no active problems to display for this patient.   Past Surgical History:  Procedure Laterality Date  . WISDOM TOOTH EXTRACTION      OB History    Gravida Para Term Preterm AB Living   1 1       1    SAB TAB Ectopic Multiple Live Births                   Home Medications    Prior to Admission medications   Medication Sig Start Date End Date Taking? Authorizing Provider  Aspirin-Salicylamide-Caffeine (BC HEADACHE PO) Take 1 packet by mouth daily as needed (headache).   Yes [provider]  PAZEO 0.7 % SOLN Place 1 drop into both eyes daily. 05/16/15  Yes [provider]  HYDROcodone-acetaminophen (NORCO/VICODIN) 5-325 MG tablet Take 1 tablet by mouth every 6 (six) hours as  needed for severe pain. 05/30/17   Cristina GongHammond, Elizabeth W, PA-C  sulfamethoxazole-trimethoprim (BACTRIM DS,SEPTRA DS) 800-160 MG tablet Take 1 tablet by mouth 2 (two) times daily. 05/30/17 06/04/17  Cristina GongHammond, Elizabeth W, PA-C    Family History Family History  Problem Relation Age of Onset  . Migraines Mother   . Gallbladder disease Mother   . Nephrolithiasis Mother   . Asthma Mother   . Cancer Maternal Grandfather   . Hernia Brother   . Asthma Daughter   . Cancer Maternal Aunt     Social History Social History  Substance Use Topics  . Smoking status: Never Smoker  . Smokeless tobacco: Never Used  . Alcohol use Yes     Comment: occasional     Allergies   Fish allergy and Strawberry extract   Review of Systems Review of Systems  Constitutional: Negative for chills, diaphoresis and fever.  HENT: Negative for ear pain and sore throat.   Eyes: Negative for pain and visual disturbance.  Respiratory: Negative for cough and shortness of breath.   Cardiovascular: Negative for chest pain and palpitations.  Gastrointestinal: Negative for abdominal pain and vomiting.  Genitourinary: Negative for dysuria and hematuria.  Musculoskeletal: Negative for arthralgias and back pain.  Skin: Positive  for wound. Negative for color change and rash.  Neurological: Negative for seizures and syncope.  All other systems reviewed and are negative.    Physical Exam Updated Vital Signs BP 115/71 (BP Location: Right Arm)   Pulse 62   Temp 98.7 F (37.1 C) (Oral)   Resp 18   SpO2 100%   Physical Exam  Constitutional: She appears well-developed and well-nourished. No distress.  HENT:  Head: Normocephalic and atraumatic.  Eyes: Conjunctivae are normal.  Neck: Neck supple.  Cardiovascular: Normal rate and regular rhythm.   No murmur heard. Pulmonary/Chest: Effort normal and breath sounds normal. No respiratory distress.  Abdominal: Soft. There is no tenderness.  Genitourinary:    Genitourinary Comments: Limited GU exam performed, visualization of right lateral labia with 1cmx1cm pustule consistent with abscess.  No obvious surrounding cellulitis.  Examination of the remainder of the vulva or other GU structures was not performed.   Musculoskeletal: She exhibits no edema.  Neurological: She is alert.  Skin: Skin is warm and dry.  1cm healing wound to superior pubic area /lower abdomen.  No obvious abscess or cellulitis.  3cmx1cm area of swelling, erythema, with fluctance that is TTP in left axilla consistent with an abscess. No surrounding cellulitis.   Psychiatric: She has a normal mood and affect.  Nursing note and vitals reviewed.    ED Treatments / Results  Labs (all labs ordered are listed, but only abnormal results are displayed) Labs Reviewed - No data to display  EKG  EKG Interpretation None       Radiology No results found.  Procedures .Marland KitchenIncision and Drainage Performed by: Cristina Gong Authorized by: Cristina Gong   Consent:    Consent obtained:  Verbal   Consent given by:  Patient   Risks discussed:  Bleeding, damage to other organs, incomplete drainage, pain and infection   Alternatives discussed:  No treatment, alternative treatment and referral Location:    Type:  Abscess   Size:  3cmx1cm   Location: L axilla. Pre-procedure details:    Skin preparation:  Chloraprep Anesthesia (see MAR for exact dosages):    Anesthesia method:  Local infiltration   Local anesthetic:  Lidocaine 2% WITH epi Procedure type:    Complexity:  Complex Procedure details:    Incision types:  Stab incision   Scalpel blade:  11   Wound management:  Irrigated with saline and probed and deloculated   Drainage:  Purulent   Drainage amount:  Moderate   Packing materials:  1/4 in iodoform gauze   Amount 1/4" iodoform:  One piece, approx 10 cm Post-procedure details:    Patient tolerance of procedure:  Tolerated well, no immediate  complications .Marland KitchenIncision and Drainage Performed by: Cristina Gong Authorized by: Cristina Gong   Consent:    Consent obtained:  Verbal   Consent given by:  Patient   Risks discussed:  Bleeding, incomplete drainage, pain, infection and damage to other organs   Alternatives discussed:  No treatment, alternative treatment and referral Location:    Type:  Abscess   Size:  1cm   Location:  Anogenital   Anogenital location:  Vulva (right lateral labia) Pre-procedure details:    Skin preparation:  Chloraprep Anesthesia (see MAR for exact dosages):    Anesthesia method:  Local infiltration   Local anesthetic:  Lidocaine 2% WITH epi Procedure type:    Complexity:  Simple Procedure details:    Incision types:  Stab incision   Incision depth:  Dermal  Scalpel blade:  11   Wound management:  Irrigated with saline and probed and deloculated   Drainage:  Serosanguinous and purulent   Drainage amount:  Scant   Wound treatment:  Wound left open   Packing materials:  None Post-procedure details:    Patient tolerance of procedure:  Tolerated well, no immediate complications Comments:     Abscess with 1-2 mm area of drainage before procedure, area of drainage was extended, the abscess was drained and irrigated.     Medications Ordered in ED Medications  lidocaine-EPINEPHrine (XYLOCAINE W/EPI) 2 %-1:200000 (PF) injection 20 mL (20 mLs Infiltration Given by Other 05/30/17 1811)  Tdap (BOOSTRIX) injection 0.5 mL (0.5 mLs Intramuscular Given 05/30/17 1812)     Initial Impression / Assessment and Plan / ED Course  I have reviewed the triage vital signs and the nursing notes.  Pertinent labs & imaging results that were available during my care of the patient were reviewed by me and considered in my medical decision making (see chart for details).    Patient with skin abscess amenable to incision and drainage. Abscess on left axilla was large enough to warrant packing due to  abscess cavity formation.   Abscess on right lateral labia was not large enough to warrant packing or drain,  wound recheck in 2 days. Encouraged home warm soaks and flushing.  Mild signs of cellulitis is surrounding skin.  Will d/c to home. With Ex for bactrim based on multiple abscesses and presence of genital abscess. Patient tetanus updated.  Patient instructed on OTC pain medication and given limited supply of norco for break through pain.   Final Clinical Impressions(s) / ED Diagnoses   Final diagnoses:  Abscess  Abscess of left axilla  Labial abscess    New Prescriptions Discharge Medication List as of 05/30/2017  7:14 PM    START taking these medications   Details  HYDROcodone-acetaminophen (NORCO/VICODIN) 5-325 MG tablet Take 1 tablet by mouth every 6 (six) hours as needed for severe pain., Starting Wed 05/30/2017, Print    sulfamethoxazole-trimethoprim (BACTRIM DS,SEPTRA DS) 800-160 MG tablet Take 1 tablet by mouth 2 (two) times daily., Starting Wed 05/30/2017, Until Mon 06/04/2017, Print         Cristina Gong, PA-C 06/01/17 1124    Alvira Monday, MD 06/03/17 2040

## 2017-05-30 NOTE — Discharge Instructions (Signed)
Please take Ibuprofen (Advil, motrin) and Tylenol (acetaminophen) to relieve your pain.  You may take up to 800 MG (4 pills) of normal strength ibuprofen every 8 hours as needed.  In between doses of ibuprofen you make take tylenol, up to 1,000 mg (two extra strength pills).  Do not take more than 3,000 mg tylenol in a 24 hour period.  Please check all medication labels as many medications such as pain and cold medications may contain tylenol.  Do not drink alcohol while taking these medications.  Do not take other NSAID'S while taking ibuprofen (such as aleve or naproxen).  Please take ibuprofen with food to decrease stomach upset.  You are being prescribed a medication which may make you sleepy. For 24 hours after one dose please do not drive, operate heavy machinery, care for a small child with out another adult present, or perform any activities that may cause harm to you or someone else if you were to fall asleep or be impaired.   If you are using hormone based contraception your antibiotics will make it not work.  Please use back up protection.

## 2017-05-30 NOTE — ED Triage Notes (Signed)
Pt c/o draining abscesses to left axilla, bilateral groin, right perineal area outside of vagina, all draining non odorous pus. No nausea, emesis, fevers, chills.

## 2017-06-01 ENCOUNTER — Encounter (HOSPITAL_COMMUNITY): Payer: Self-pay | Admitting: Oncology

## 2017-06-01 DIAGNOSIS — N764 Abscess of vulva: Secondary | ICD-10-CM | POA: Diagnosis present

## 2017-06-01 DIAGNOSIS — Z79899 Other long term (current) drug therapy: Secondary | ICD-10-CM | POA: Diagnosis not present

## 2017-06-01 NOTE — ED Triage Notes (Signed)
Pt presents d/t left sided labia abscess.  Pt seen here 2 days ago for abscess to left axilla that she needs the dressing changed.  Pt states that this is a chronic problem.

## 2017-06-02 ENCOUNTER — Emergency Department (HOSPITAL_COMMUNITY)
Admission: EM | Admit: 2017-06-02 | Discharge: 2017-06-02 | Disposition: A | Discharge status: Home / Self Care | Payer: Medicaid Other | Attending: Emergency Medicine | Admitting: Emergency Medicine

## 2017-06-02 DIAGNOSIS — L0291 Cutaneous abscess, unspecified: Secondary | ICD-10-CM

## 2017-06-02 MED ORDER — LIDOCAINE HCL (PF) 1 % IJ SOLN
5.0000 mL | Freq: Once | INTRAMUSCULAR | Status: AC
  Administered 2017-06-02: 5 mL
  Filled 2017-06-02: qty 30

## 2017-06-02 MED ORDER — SULFAMETHOXAZOLE-TRIMETHOPRIM 800-160 MG PO TABS: 1.0000 | ORAL_TABLET | Freq: Two times a day (BID) | ORAL | 0 refills | Status: DC

## 2017-06-02 MED ORDER — CEPHALEXIN 500 MG PO CAPS: 500.0000 mg | ORAL_CAPSULE | Freq: Four times a day (QID) | ORAL | 0 refills | Status: AC

## 2017-06-02 NOTE — ED Provider Notes (Signed)
[facility]-EMERGENCY DEPT Provider Note   CSN: [id number] Arrival date & time: 06/01/17  2007     History   Chief Complaint Chief Complaint  Patient presents with  . Abscess    HPI Stacey Mann is a 26 y.o. female.  Patient presents with another abscess after having had 2 I&D procedures within the last week, one to left axilla and one to right labia. She returns tonight with new abscess to left labia. No drainage. No fever, nausea, vomiting.    The history is provided by the patient. No language interpreter was used.    Past Medical History:  Diagnosis Date  . Anorectal fissure   . Blood transfusion   . Cervicitis   . H/O bladder infections   . Hx of blood clots   . Post partum depression     There are no active problems to display for this patient.   Past Surgical History:  Procedure Laterality Date  . WISDOM TOOTH EXTRACTION      OB History    Gravida Para Term Preterm AB Living   1 1       1    SAB TAB Ectopic Multiple Live Births                   Home Medications    Prior to Admission medications   Medication Sig Start Date End Date Taking? Authorizing Provider  Aspirin-Salicylamide-Caffeine (BC HEADACHE PO) Take 1 packet by mouth daily as needed (headache).    [provider]  HYDROcodone-acetaminophen (NORCO/VICODIN) 5-325 MG tablet Take 1 tablet by mouth every 6 (six) hours as needed for severe pain. 05/30/17   Cristina Gong, PA-C  PAZEO 0.7 % SOLN Place 1 drop into both eyes daily. 05/16/15   [provider]  sulfamethoxazole-trimethoprim (BACTRIM DS,SEPTRA DS) 800-160 MG tablet Take 1 tablet by mouth 2 (two) times daily. 05/30/17 06/04/17  Cristina Gong, PA-C    Family History Family History  Problem Relation Age of Onset  . Migraines Mother   . Gallbladder disease Mother   . Nephrolithiasis Mother   . Asthma Mother   . Cancer Maternal Grandfather   . Hernia Brother   . Asthma Daughter   . Cancer Maternal  Aunt     Social History Social History  Substance Use Topics  . Smoking status: Never Smoker  . Smokeless tobacco: Never Used  . Alcohol use Yes     Comment: occasional     Allergies   Fish allergy and Strawberry extract   Review of Systems Review of Systems  Constitutional: Negative for chills and fever.  Gastrointestinal: Negative.  Negative for nausea.  Genitourinary: Positive for vaginal pain.  Musculoskeletal: Negative.   Skin: Positive for wound.       C/O Abscess.  Neurological: Negative.      Physical Exam Updated Vital Signs BP 120/66 (BP Location: Left Arm)   Pulse 65   Resp 20   LMP 05/17/2017 (Exact Date)   SpO2 100%   Physical Exam  Constitutional: She is oriented to person, place, and time. She appears well-developed and well-nourished.  Neck: Normal range of motion.  Pulmonary/Chest: Effort normal.  Genitourinary:     Neurological: She is alert and oriented to person, place, and time.  Skin: Skin is warm and dry.  Small swelling to inferior left labia that is fluctuant, mildly indurated.      ED Treatments / Results  Labs (all labs ordered are listed, but only abnormal  results are displayed) Labs Reviewed - No data to display  EKG  EKG Interpretation None       Radiology No results found.  Procedures Procedures (including critical care time) INCISION AND DRAINAGE Performed by: Elpidio AnisUPSTILL, Rodney Yera A Consent: Verbal consent obtained. Risks and benefits: risks, benefits and alternatives were discussed Type: abscess  Body area: left labia  Anesthesia: local infiltration  Incision was made with a scalpel.  Local anesthetic: lidocaine 1% w/o epinephrine  Anesthetic total: 2 ml  Complexity: complex Blunt dissection to break up loculations  Drainage: purulent  Drainage amount: small  Packing material: no packing used  Patient tolerance: Patient tolerated the procedure well with no immediate  complications.    Medications Ordered in ED Medications  lidocaine (PF) (XYLOCAINE) 1 % injection 5 mL (not administered)     Initial Impression / Assessment and Plan / ED Course  I have reviewed the triage vital signs and the nursing notes.  Pertinent labs & imaging results that were available during my care of the patient were reviewed by me and considered in my medical decision making (see chart for details).     Uncomplicated abscess to left labia, I&D'd as per above note. Will add Keflex to the Septra she is taking already. Encouraged follow up with PCP.  Final Clinical Impressions(s) / ED Diagnoses   Final diagnoses:  None   1. Left labial abscess   New Prescriptions New Prescriptions   No medications on file     Elpidio AnisUpstill, Ulysess Witz, Cordelia Poche-C 06/02/17 0244    Devoria AlbeKnapp, Iva, MD 06/02/17 [phone]

## 2017-06-02 NOTE — ED Notes (Signed)
Pt is alert and oriented x 4 and is verbally responsive and is accompanied by boyfriend, pt states that she has abscess on outside of labia to left side

## 2017-09-10 ENCOUNTER — Emergency Department (HOSPITAL_COMMUNITY)
Admission: EM | Admit: 2017-09-10 | Discharge: 2017-09-10 | Disposition: A | Discharge status: Home / Self Care | Payer: Medicaid Other | Attending: Emergency Medicine | Admitting: Emergency Medicine

## 2017-09-10 ENCOUNTER — Encounter (HOSPITAL_COMMUNITY): Payer: Self-pay

## 2017-09-10 DIAGNOSIS — K047 Periapical abscess without sinus: Secondary | ICD-10-CM | POA: Diagnosis not present

## 2017-09-10 DIAGNOSIS — Z79899 Other long term (current) drug therapy: Secondary | ICD-10-CM | POA: Insufficient documentation

## 2017-09-10 DIAGNOSIS — K0889 Other specified disorders of teeth and supporting structures: Secondary | ICD-10-CM | POA: Diagnosis present

## 2017-09-10 MED ORDER — PENICILLIN V POTASSIUM 500 MG PO TABS
500.0000 mg | ORAL_TABLET | Freq: Once | ORAL | Status: AC
  Administered 2017-09-10: 500 mg via ORAL
  Filled 2017-09-10: qty 1

## 2017-09-10 MED ORDER — NAPROXEN 500 MG PO TABS: 500.0000 mg | ORAL_TABLET | Freq: Two times a day (BID) | ORAL | 0 refills | Status: DC

## 2017-09-10 MED ORDER — PENICILLIN V POTASSIUM 500 MG PO TABS: 500.0000 mg | ORAL_TABLET | Freq: Four times a day (QID) | ORAL | 0 refills | Status: DC

## 2017-09-10 MED ORDER — IBUPROFEN 800 MG PO TABS
800.0000 mg | ORAL_TABLET | Freq: Once | ORAL | Status: AC
  Administered 2017-09-10: 800 mg via ORAL
  Filled 2017-09-10: qty 1

## 2017-09-10 NOTE — Discharge Instructions (Signed)
Take the prescribed medication as directed. Follow-up with dentist-- no dentist on call today but can follow-up with one the local clinics.  See attached paperwork for contact information. Return to the ED for new or worsening symptoms.

## 2017-09-10 NOTE — ED Provider Notes (Signed)
[name]-EMERGENCY DEPT Provider Note   CSN: [id number] Arrival date & time: 09/10/17  0340     History   Chief Complaint Chief Complaint  Patient presents with  . Dental Pain    HPI Stacey Mann is a 26 y.o. female.  The history is provided by the patient and medical records.  Dental Pain     26 y.o. F Here with right lower dental pain. She reports 2 days ago she noticed a "bump" on her gums and she popped it with her fingernail. States she did have a small amount drained out but had continued pain and swelling of the area. She denies any fever or chills. No trouble swallowing or speaking. She is not currently established with a dentist.  Past Medical History:  Diagnosis Date  . Anorectal fissure   . Blood transfusion   . Cervicitis   . H/O bladder infections   . Hx of blood clots   . Post partum depression     There are no active problems to display for this patient.   Past Surgical History:  Procedure Laterality Date  . WISDOM TOOTH EXTRACTION      OB History    Gravida Para Term Preterm AB Living   1 1       1    SAB TAB Ectopic Multiple Live Births                   Home Medications    Prior to Admission medications   Medication Sig Start Date End Date Taking? Authorizing Provider  Aspirin-Salicylamide-Caffeine (BC HEADACHE PO) Take 1 packet by mouth daily as needed (headache).    [provider]  HYDROcodone-acetaminophen (NORCO/VICODIN) 5-325 MG tablet Take 1 tablet by mouth every 6 (six) hours as needed for severe pain. 05/30/17   Cristina Gong, PA-C  PAZEO 0.7 % SOLN Place 1 drop into both eyes daily. 05/16/15   [provider]  sulfamethoxazole-trimethoprim (BACTRIM DS,SEPTRA DS) 800-160 MG tablet Take 1 tablet by mouth 2 (two) times daily. 06/02/17   Elpidio Anis, PA-C    Family History Family History  Problem Relation Age of Onset  . Migraines Mother   . Gallbladder disease Mother   .  Nephrolithiasis Mother   . Asthma Mother   . Cancer Maternal Grandfather   . Hernia Brother   . Asthma Daughter   . Cancer Maternal Aunt     Social History Social History  Substance Use Topics  . Smoking status: Never Smoker  . Smokeless tobacco: Never Used  . Alcohol use Yes     Comment: occasional     Allergies   Fish allergy and Strawberry extract   Review of Systems Review of Systems  HENT: Positive for dental problem.   All other systems reviewed and are negative.    Physical Exam Updated Vital Signs BP (!) 129/56 (BP Location: Right Arm)   Pulse 61   Temp 98.1 F (36.7 C) (Oral)   Resp 18   Ht 5\' 9"  (1.753 m)   Wt 77.1 kg (170 lb)   SpO2 97%   BMI 25.10 kg/m   Physical Exam  Constitutional: She is oriented to person, place, and time. She appears well-developed and well-nourished.  HENT:  Head: Normocephalic and atraumatic.  Mouth/Throat: Oropharynx is clear and moist.  Teeth largely in fair dentition, right lower premolar with small abscess on external gingiva that has erupted, no active drainage; surrounding gingiva swollen with some extension  into inner cheek, handling secretions appropriately, no trismus, no facial or neck swelling, normal phonation without stridor  Eyes: Pupils are equal, round, and reactive to light. Conjunctivae and EOM are normal.  Neck: Normal range of motion.  Cardiovascular: Normal rate, regular rhythm and normal heart sounds.   Pulmonary/Chest: Effort normal and breath sounds normal. No respiratory distress. She has no wheezes.  Abdominal: Soft. Bowel sounds are normal.  Musculoskeletal: Normal range of motion.  Neurological: She is alert and oriented to person, place, and time.  Skin: Skin is warm and dry.  Psychiatric: She has a normal mood and affect.  Nursing note and vitals reviewed.    ED Treatments / Results  Labs (all labs ordered are listed, but only abnormal results are displayed) Labs Reviewed - No data to  display  EKG  EKG Interpretation None       Radiology No results found.  Procedures Procedures (including critical care time)  Medications Ordered in ED Medications  penicillin v potassium (VEETID) tablet 500 mg (not administered)  ibuprofen (ADVIL,MOTRIN) tablet 800 mg (not administered)     Initial Impression / Assessment and Plan / ED Course  I have reviewed the triage vital signs and the nursing notes.  Pertinent labs & imaging results that were available during my care of the patient were reviewed by me and considered in my medical decision making (see chart for details).  26 -year-old female here with right lower dental pain. On exam appears to have abscess that is our erupted. Does have some gingival swelling with extension into the cheek. No neck swelling. Normal phonation without stridor, handling secretions well. Do not have clinical suspicion for Ludwig's angina or deep space infection of the neck. Will start on antibiotics and referred to dentist for follow-up-- given list of local clinics as no dentist on call today.  Discussed plan with patient, she acknowledged understanding and agreed with plan of care.  Return precautions given for new or worsening symptoms.  Final Clinical Impressions(s) / ED Diagnoses   Final diagnoses:  Dental infection    New Prescriptions New Prescriptions   NAPROXEN (NAPROSYN) 500 MG TABLET    Take 1 tablet (500 mg total) by mouth 2 (two) times daily with a meal.   PENICILLIN V POTASSIUM (VEETID) 500 MG TABLET    Take 1 tablet (500 mg total) by mouth 4 (four) times daily.     Garlon HatchetSanders, Alexandrina Fiorini M, PA-C 09/10/17 0508    Ward, Layla MawKristen N, DO 09/10/17 [phone]

## 2017-09-10 NOTE — ED Triage Notes (Signed)
Right lower tooth pain for about a month now painful and swelling to jaw voiced some swelling noted.

## 2017-11-18 ENCOUNTER — Encounter (HOSPITAL_COMMUNITY): Payer: Self-pay | Admitting: Emergency Medicine

## 2017-11-18 ENCOUNTER — Emergency Department (HOSPITAL_COMMUNITY)
Admission: EM | Admit: 2017-11-18 | Discharge: 2017-11-18 | Disposition: A | Discharge status: Home / Self Care | Payer: Medicaid Other | Attending: Emergency Medicine | Admitting: Emergency Medicine

## 2017-11-18 ENCOUNTER — Other Ambulatory Visit: Payer: Self-pay

## 2017-11-18 DIAGNOSIS — R21 Rash and other nonspecific skin eruption: Secondary | ICD-10-CM | POA: Insufficient documentation

## 2017-11-18 DIAGNOSIS — L02411 Cutaneous abscess of right axilla: Secondary | ICD-10-CM | POA: Diagnosis not present

## 2017-11-18 DIAGNOSIS — B9562 Methicillin resistant Staphylococcus aureus infection as the cause of diseases classified elsewhere: Secondary | ICD-10-CM | POA: Diagnosis not present

## 2017-11-18 DIAGNOSIS — A4902 Methicillin resistant Staphylococcus aureus infection, unspecified site: Secondary | ICD-10-CM

## 2017-11-18 DIAGNOSIS — L02412 Cutaneous abscess of left axilla: Secondary | ICD-10-CM | POA: Insufficient documentation

## 2017-11-18 HISTORY — DX: Dermatitis, unspecified: L30.9

## 2017-11-18 MED ORDER — MUPIROCIN CALCIUM 2 % NA OINT: TOPICAL_OINTMENT | NASAL | 0 refills | Status: DC

## 2017-11-18 MED ORDER — DOXYCYCLINE HYCLATE 100 MG PO CAPS: 100.0000 mg | ORAL_CAPSULE | Freq: Two times a day (BID) | ORAL | 0 refills | Status: DC

## 2017-11-18 NOTE — ED Triage Notes (Signed)
Pt states she has recurrent abscesses and a rash under her arms  Pt states when the abscesses drain it is blood and pus  Pt states she currently has one on her left thigh and one under her right breast

## 2017-11-18 NOTE — ED Provider Notes (Signed)
[facility]-EMERGENCY DEPT Provider Note   CSN: [id number] Arrival date & time: 11/18/17  0410     History   Chief Complaint Chief Complaint  Patient presents with  . Abscess  . Rash    HPI Stacey Mann is a 26 y.o. female.  Patient presents with multiple areas of painful swelling and reports history of frequent abscesses to axilla, now more spread out to axilla, thigh and sometimes in her nose. No current fever or drainage to any site.    The history is provided by the patient. No language interpreter was used.  Abscess  Associated symptoms: no fever   Rash      Past Medical History:  Diagnosis Date  . Anorectal fissure   . Blood transfusion   . Cervicitis   . Eczema   . H/O bladder infections   . Hx of blood clots   . Post partum depression     There are no active problems to display for this patient.   Past Surgical History:  Procedure Laterality Date  . WISDOM TOOTH EXTRACTION      OB History    Gravida Para Term Preterm AB Living   1 1       1    SAB TAB Ectopic Multiple Live Births                   Home Medications    Prior to Admission medications   Medication Sig Start Date End Date Taking? Authorizing Provider  Aspirin-Salicylamide-Caffeine (BC HEADACHE PO) Take 1 packet by mouth daily as needed (headache).    [provider]  doxycycline (VIBRAMYCIN) 100 MG capsule Take 1 capsule (100 mg total) by mouth 2 (two) times daily. 11/18/17   Elpidio AnisUpstill, Marenda Accardi, PA-C  HYDROcodone-acetaminophen (NORCO/VICODIN) 5-325 MG tablet Take 1 tablet by mouth every 6 (six) hours as needed for severe pain. 05/30/17   Cristina GongHammond, Elizabeth W, PA-C  mupirocin nasal ointment (BACTROBAN) 2 % Apply in each nostril daily 11/18/17   Elpidio AnisUpstill, Issak Goley, PA-C  naproxen (NAPROSYN) 500 MG tablet Take 1 tablet (500 mg total) by mouth 2 (two) times daily with a meal. 09/10/17   Garlon HatchetSanders, Lisa M, PA-C  PAZEO 0.7 % SOLN Place 1 drop into both eyes  daily. 05/16/15   [provider]  penicillin v potassium (VEETID) 500 MG tablet Take 1 tablet (500 mg total) by mouth 4 (four) times daily. 09/10/17   Garlon HatchetSanders, Lisa M, PA-C  sulfamethoxazole-trimethoprim (BACTRIM DS,SEPTRA DS) 800-160 MG tablet Take 1 tablet by mouth 2 (two) times daily. 06/02/17   Elpidio AnisUpstill, Ariya Bohannon, PA-C    Family History Family History  Problem Relation Age of Onset  . Migraines Mother   . Gallbladder disease Mother   . Nephrolithiasis Mother   . Asthma Mother   . Cancer Maternal Grandfather   . Hernia Brother   . Asthma Daughter   . Cancer Maternal Aunt     Social History Social History   Tobacco Use  . Smoking status: Never Smoker  . Smokeless tobacco: Never Used  Substance Use Topics  . Alcohol use: Yes    Comment: occasional  . Drug use: Yes    Types: Marijuana     Allergies   Fish allergy and Strawberry extract   Review of Systems Review of Systems  Constitutional: Negative for fever.  Musculoskeletal: Negative for myalgias.  Skin: Positive for rash.     Physical Exam Updated Vital Signs BP 120/71 (BP Location: Left Arm)  Pulse (!) 55   Temp 97.7 F (36.5 C) (Oral)   Resp 18   Ht 5\' 9"  (1.753 m)   Wt 74.8 kg (165 lb)   LMP 11/11/2017 (Approximate)   SpO2 100%   BMI 24.37 kg/m   Physical Exam  Constitutional: She is oriented to person, place, and time. She appears well-developed and well-nourished.  HENT:  Nostrils are normal in appearance.   Neck: Normal range of motion.  Pulmonary/Chest: Effort normal.  Neurological: She is alert and oriented to person, place, and time.  Skin: Skin is warm and dry.  Multiple small nodular swollen areas to bilateral axilla, right chest wall and left anterior thigh. No fluctuance, cellulitic changes or drainage.      ED Treatments / Results  Labs (all labs ordered are listed, but only abnormal results are displayed) Labs Reviewed - No data to display  EKG  EKG  Interpretation None       Radiology No results found.  Procedures Procedures (including critical care time)  Medications Ordered in ED Medications - No data to display   Initial Impression / Assessment and Plan / ED Course  I have reviewed the triage vital signs and the nursing notes.  Pertinent labs & imaging results that were available during my care of the patient were reviewed by me and considered in my medical decision making (see chart for details).     Patient with previous abscesses now with diffuse nodular areas concerning for MRSA. She reports periodic symptoms in her nose. Will treat with 14 days of Doxycycline and Bactroban nasal ointment. She is otherwise very well appearing.   Final Clinical Impressions(s) / ED Diagnoses   Final diagnoses:  MRSA (methicillin resistant Staphylococcus aureus) infection    ED Discharge Orders        Ordered    doxycycline (VIBRAMYCIN) 100 MG capsule  2 times daily     11/18/17 0448    mupirocin nasal ointment (BACTROBAN) 2 %     11/18/17 0448       Elpidio AnisUpstill, Lashawnda Hancox, PA-C 11/18/17 0453    Azalia Bilisampos, Kevin, MD 11/18/17 [phone]

## 2018-01-21 ENCOUNTER — Inpatient Hospital Stay (HOSPITAL_COMMUNITY)
Admission: AD | Admit: 2018-01-21 | Discharge: 2018-01-21 | Disposition: A | Discharge status: Home / Self Care | Payer: Medicaid Other | Source: Ambulatory Visit | Attending: Obstetrics and Gynecology | Admitting: Obstetrics and Gynecology

## 2018-01-21 ENCOUNTER — Other Ambulatory Visit: Payer: Self-pay

## 2018-01-21 ENCOUNTER — Inpatient Hospital Stay (HOSPITAL_COMMUNITY): Payer: Medicaid Other

## 2018-01-21 ENCOUNTER — Encounter (HOSPITAL_COMMUNITY): Payer: Self-pay | Admitting: *Deleted

## 2018-01-21 DIAGNOSIS — Z86718 Personal history of other venous thrombosis and embolism: Secondary | ICD-10-CM | POA: Diagnosis not present

## 2018-01-21 DIAGNOSIS — Z91018 Allergy to other foods: Secondary | ICD-10-CM | POA: Insufficient documentation

## 2018-01-21 DIAGNOSIS — Z79899 Other long term (current) drug therapy: Secondary | ICD-10-CM | POA: Insufficient documentation

## 2018-01-21 DIAGNOSIS — Z9889 Other specified postprocedural states: Secondary | ICD-10-CM | POA: Insufficient documentation

## 2018-01-21 DIAGNOSIS — Z79891 Long term (current) use of opiate analgesic: Secondary | ICD-10-CM | POA: Insufficient documentation

## 2018-01-21 DIAGNOSIS — F53 Postpartum depression: Secondary | ICD-10-CM | POA: Insufficient documentation

## 2018-01-21 DIAGNOSIS — O208 Other hemorrhage in early pregnancy: Secondary | ICD-10-CM | POA: Insufficient documentation

## 2018-01-21 DIAGNOSIS — Z8614 Personal history of Methicillin resistant Staphylococcus aureus infection: Secondary | ICD-10-CM | POA: Insufficient documentation

## 2018-01-21 DIAGNOSIS — R109 Unspecified abdominal pain: Secondary | ICD-10-CM | POA: Insufficient documentation

## 2018-01-21 DIAGNOSIS — Z841 Family history of disorders of kidney and ureter: Secondary | ICD-10-CM | POA: Insufficient documentation

## 2018-01-21 DIAGNOSIS — O26891 Other specified pregnancy related conditions, first trimester: Secondary | ICD-10-CM | POA: Insufficient documentation

## 2018-01-21 DIAGNOSIS — Z809 Family history of malignant neoplasm, unspecified: Secondary | ICD-10-CM | POA: Diagnosis not present

## 2018-01-21 DIAGNOSIS — Z3A01 Less than 8 weeks gestation of pregnancy: Secondary | ICD-10-CM | POA: Insufficient documentation

## 2018-01-21 DIAGNOSIS — O219 Vomiting of pregnancy, unspecified: Secondary | ICD-10-CM

## 2018-01-21 DIAGNOSIS — O26899 Other specified pregnancy related conditions, unspecified trimester: Secondary | ICD-10-CM

## 2018-01-21 DIAGNOSIS — Z91013 Allergy to seafood: Secondary | ICD-10-CM | POA: Insufficient documentation

## 2018-01-21 DIAGNOSIS — Z825 Family history of asthma and other chronic lower respiratory diseases: Secondary | ICD-10-CM | POA: Insufficient documentation

## 2018-01-21 DIAGNOSIS — Z791 Long term (current) use of non-steroidal anti-inflammatories (NSAID): Secondary | ICD-10-CM | POA: Diagnosis not present

## 2018-01-21 DIAGNOSIS — O209 Hemorrhage in early pregnancy, unspecified: Secondary | ICD-10-CM | POA: Diagnosis not present

## 2018-01-21 DIAGNOSIS — O418X10 Other specified disorders of amniotic fluid and membranes, first trimester, not applicable or unspecified: Secondary | ICD-10-CM

## 2018-01-21 DIAGNOSIS — O468X1 Other antepartum hemorrhage, first trimester: Secondary | ICD-10-CM

## 2018-01-21 LAB — URINALYSIS, ROUTINE W REFLEX MICROSCOPIC
BILIRUBIN URINE: NEGATIVE
GLUCOSE, UA: NEGATIVE mg/dL
Ketones, ur: 20 mg/dL — AB
NITRITE: NEGATIVE
PH: 6 (ref 5.0–8.0)
Protein, ur: NEGATIVE mg/dL
SPECIFIC GRAVITY, URINE: 1.02 (ref 1.005–1.030)

## 2018-01-21 LAB — CBC
HCT: 36.4 % (ref 36.0–46.0)
Hemoglobin: 12.9 g/dL (ref 12.0–15.0)
MCH: 30.2 pg (ref 26.0–34.0)
MCHC: 35.4 g/dL (ref 30.0–36.0)
MCV: 85.2 fL (ref 78.0–100.0)
Platelets: 250 10*3/uL (ref 150–400)
RBC: 4.27 MIL/uL (ref 3.87–5.11)
RDW: 11.9 % (ref 11.5–15.5)
WBC: 7.3 10*3/uL (ref 4.0–10.5)

## 2018-01-21 LAB — WET PREP, GENITAL
SPERM: NONE SEEN
Trich, Wet Prep: NONE SEEN

## 2018-01-21 LAB — ABO/RH: ABO/RH(D): A POS

## 2018-01-21 LAB — HCG, QUANTITATIVE, PREGNANCY: hCG, Beta Chain, Quant, S: 57089 m[IU]/mL — ABNORMAL HIGH (ref <5)

## 2018-01-21 MED ORDER — PROMETHAZINE HCL 12.5 MG PO TABS: 12.5000 mg | ORAL_TABLET | Freq: Four times a day (QID) | ORAL | 0 refills | Status: DC | PRN

## 2018-01-21 NOTE — MAU Note (Signed)
Been bleeding for 2 days.  Went to HD last week for preg confirmation. Cramping like a period.  Can't hold anything down.

## 2018-01-21 NOTE — MAU Provider Note (Signed)
History     CSN: [id number]  Arrival date and time: 01/21/18 1547   None     Chief Complaint  Patient presents with  . Vaginal Bleeding  . Abdominal Pain   HPI Stacey Mann is 27 y.o. G2P1 [id number] weeks presenting with vaginal bleeding X 2 days.  + for menstrual like cramping  X 1 week.  + for nausea and vomiting. Vomiting at least 3 X day, unable to keep food down.  Had +_ UPT at [facility] last week.   Patient reports MD told her she had MRSA but was not cultured.  We will take precautions. She has not decided where to get prenatal care.   Past Medical History:  Diagnosis Date  . Anorectal fissure   . Blood transfusion   . Cervicitis   . Eczema   . H/O bladder infections   . Hx of blood clots   . Post partum depression     Past Surgical History:  Procedure Laterality Date  . WISDOM TOOTH EXTRACTION      Family History  Problem Relation Age of Onset  . Migraines Mother   . Gallbladder disease Mother   . Nephrolithiasis Mother   . Asthma Mother   . Cancer Maternal Grandfather   . Hernia Brother   . Asthma Daughter   . Cancer Maternal Aunt     Social History   Tobacco Use  . Smoking status: Never Smoker  . Smokeless tobacco: Never Used  Substance Use Topics  . Alcohol use: Yes    Comment: occasional  . Drug use: Yes    Types: Marijuana    Allergies:  Allergies  Allergen Reactions  . Fish Allergy Hives  . Strawberry Extract Hives and Itching    Medications Prior to Admission  Medication Sig Dispense Refill Last Dose  . Prenatal Vit-Fe Fumarate-FA (PRENATAL MULTIVITAMIN) TABS tablet Take 1 tablet by mouth daily at 12 noon.   Past Week at Unknown time  . doxycycline (VIBRAMYCIN) 100 MG capsule Take 1 capsule (100 mg total) by mouth 2 (two) times daily. 20 capsule 0   . HYDROcodone-acetaminophen (NORCO/VICODIN) 5-325 MG tablet Take 1 tablet by mouth every 6 (six) hours as needed for severe pain. 6 tablet 0   . mupirocin nasal ointment (BACTROBAN) 2 %  Apply in each nostril daily 10 g 0   . naproxen (NAPROSYN) 500 MG tablet Take 1 tablet (500 mg total) by mouth 2 (two) times daily with a meal. 30 tablet 0   . penicillin v potassium (VEETID) 500 MG tablet Take 1 tablet (500 mg total) by mouth 4 (four) times daily. 40 tablet 0     Review of Systems  Constitutional: Positive for appetite change. Negative for fever.  Respiratory: Negative for shortness of breath.   Cardiovascular: Negative for chest pain.  Gastrointestinal: Positive for abdominal pain, constipation, nausea and vomiting. Negative for diarrhea.  Genitourinary: Positive for hematuria, pelvic pain, vaginal bleeding and vaginal discharge (small amount of creamy discharge). Negative for dysuria and frequency.  Musculoskeletal: Positive for back pain.  Neurological: Positive for headaches.   Physical Exam   Blood pressure 111/65, pulse 72, temperature 98.6 F (37 C), temperature source Oral, resp. rate 16, height 5\' 9"  (1.753 m), weight 149 lb 12 oz (67.9 kg), last menstrual period 12/04/2017, SpO2 100 %.  Physical Exam  Nursing note and vitals reviewed. Constitutional: She is oriented to person, place, and time. She appears well-developed and well-nourished. No distress.  HENT:  Head:  Normocephalic.  Neck: Normal range of motion.  Cardiovascular: Normal rate.  Respiratory: Effort normal.  GI: Soft. She exhibits no distension and no mass. There is no tenderness. There is no rebound and no guarding.  Genitourinary: There is no rash, tenderness or lesion on the right labia. There is no rash, tenderness or lesion on the left labia. Uterus is enlarged (6-7 weeks size. ). Uterus is not tender. Cervix exhibits no motion tenderness, no discharge and no friability. Right adnexum displays no mass, no tenderness and no fullness. Left adnexum displays no mass, no tenderness and no fullness. There is bleeding (mod amount of dark brown clumpy discharge noted in vaginal canal. Neg for active  bleeding) in the vagina. No tenderness in the vagina. Vaginal discharge found.  Neurological: She is alert and oriented to person, place, and time.  Skin: Skin is warm and dry.  Psychiatric: She has a normal mood and affect. Her behavior is normal. Thought content normal.   Precautions  for MRSA taken.  Results for orders placed or performed during the hospital encounter of 01/21/18 (from the past 24 hour(s))  Urinalysis, Routine w reflex microscopic     Status: Abnormal   Collection Time: 01/21/18  4:25 PM  Result Value Ref Range   Color, Urine YELLOW YELLOW   APPearance HAZY (A) CLEAR   Specific Gravity, Urine 1.020 1.005 - 1.030   pH 6.0 5.0 - 8.0   Glucose, UA NEGATIVE NEGATIVE mg/dL   Hgb urine dipstick MODERATE (A) NEGATIVE   Bilirubin Urine NEGATIVE NEGATIVE   Ketones, ur 20 (A) NEGATIVE mg/dL   Protein, ur NEGATIVE NEGATIVE mg/dL   Nitrite NEGATIVE NEGATIVE   Leukocytes, UA TRACE (A) NEGATIVE   RBC / HPF 0-5 0 - 5 RBC/hpf   WBC, UA 0-5 0 - 5 WBC/hpf   Bacteria, UA RARE (A) NONE SEEN   Squamous Epithelial / LPF 0-5 (A) NONE SEEN   Mucus PRESENT   CBC     Status: None   Collection Time: 01/21/18  4:31 PM  Result Value Ref Range   WBC 7.3 4.0 - 10.5 K/uL   RBC 4.27 3.87 - 5.11 MIL/uL   Hemoglobin 12.9 12.0 - 15.0 g/dL   HCT 16.1 09.6 - 04.5 %   MCV 85.2 78.0 - 100.0 fL   MCH 30.2 26.0 - 34.0 pg   MCHC 35.4 30.0 - 36.0 g/dL   RDW 40.9 81.1 - 91.4 %   Platelets 250 150 - 400 K/uL  ABO/Rh     Status: None   Collection Time: 01/21/18  4:31 PM  Result Value Ref Range   ABO/RH(D)      A POS Performed at [facility], [address], [place], [place] [address]   hCG, quantitative, pregnancy     Status: Abnormal   Collection Time: 01/21/18  4:31 PM  Result Value Ref Range   hCG, Beta Chain, Quant, S 57,089 (H) <5 mIU/mL  Wet prep, genital     Status: Abnormal   Collection Time: 01/21/18  5:25 PM  Result Value Ref Range   Yeast Wet Prep HPF POC PRESENT (A) NONE  SEEN   Trich, Wet Prep NONE SEEN NONE SEEN   Clue Cells Wet Prep HPF POC PRESENT (A) NONE SEEN   WBC, Wet Prep HPF POC FEW (A) NONE SEEN   Sperm NONE SEEN    US Ob Comp Less 14 Wks  Result Date: 01/21/2018 CLINICAL DATA:  First trimester pregnancy with vaginal bleeding for 2  days. LMP 12/04/2017. EXAM: OBSTETRIC <14 WK [place] AND TRANSVAGINAL OB US TECHNIQUE: Both transabdominal and transvaginal ultrasound examinations were performed for complete evaluation of the gestation as well as the maternal uterus, adnexal regions, and pelvic cul-de-sac. Transvaginal technique was performed to assess early pregnancy. COMPARISON:  None. FINDINGS: Intrauterine gestational sac: Visualized Yolk sac:  Visualized Embryo:  Visualized Cardiac Activity: Visualized Heart Rate: 114 bpm CRL:  5.2 mm   6 w   1 d                  [place] EDC: 09/15/2018 Subchorionic hemorrhage:  Small subchorionic hematoma. Maternal uterus/adnexae: Both maternal ovaries are visualized. There is a small corpus luteal cyst on the right. No suspicious adnexal findings. A small amount of free pelvic fluid is present. IMPRESSION: 1. Single live intrauterine pregnancy with best estimated gestational age of [redacted] weeks 1 day. 2. Small subchorionic hematoma. 3. No suspicious adnexal findings. Electronically Signed   By: Carey Bullocks M.D.   On: 01/21/2018 18:24   US Ob Transvaginal  Result Date: 01/21/2018 CLINICAL DATA:  First trimester pregnancy with vaginal bleeding for 2 days. LMP 12/04/2017. EXAM: OBSTETRIC <14 WK [place] AND TRANSVAGINAL OB US TECHNIQUE: Both transabdominal and transvaginal ultrasound examinations were performed for complete evaluation of the gestation as well as the maternal uterus, adnexal regions, and pelvic cul-de-sac. Transvaginal technique was performed to assess early pregnancy. COMPARISON:  None. FINDINGS: Intrauterine gestational sac: Visualized Yolk sac:  Visualized Embryo:  Visualized Cardiac Activity: Visualized Heart Rate: 114 bpm  CRL:  5.2 mm   6 w   1 d                  [place] EDC: 09/15/2018 Subchorionic hemorrhage:  Small subchorionic hematoma. Maternal uterus/adnexae: Both maternal ovaries are visualized. There is a small corpus luteal cyst on the right. No suspicious adnexal findings. A small amount of free pelvic fluid is present. IMPRESSION: 1. Single live intrauterine pregnancy with best estimated gestational age of [redacted] weeks 1 day. 2. Small subchorionic hematoma. 3. No suspicious adnexal findings. Electronically Signed   By: Carey Bullocks M.D.   On: 01/21/2018 18:24   MAU Course  Procedures  MDM MSE Exam Labs U/S  Assessment and Plan  A:  Vaginal bleeding in first trimester pregnancy       Abdominal pain in first trimester pregnancy       Viable intrauterine pregnancy at [redacted]w[redacted]d gestation       Small Subchorionic hemorrhage       Nausea and vomiting in pregnancy  P: Pelvic rest until bleeding/dark brown discharge resolves      Rx for nausea and vomiting sent to pharmacy       Reviewed labs and U/S finding with patient.       Encouraged to call to make appt to begin prenatal care      Begin prenatal OTC vitamins.      Asymptomatic BV was not treated.           Dennison Mascot Key 01/21/2018, 6:45 PM

## 2018-01-21 NOTE — Discharge Instructions (Signed)
Abdominal Pain During Pregnancy Belly (abdominal) pain is common during pregnancy. Most of the time, it is not a serious problem. Other times, it can be a sign that something is wrong with the pregnancy. Always tell your doctor if you have belly pain. Follow these instructions at home: Monitor your belly pain for any changes. The following actions may help you feel better:  Do not have sex (intercourse) or put anything in your vagina until you feel better.  Rest until your pain stops.  Drink clear fluids if you feel sick to your stomach (nauseous). Do not eat solid food until you feel better.  Only take medicine as told by your doctor.  Keep all doctor visits as told.  Get help right away if:  You are bleeding, leaking fluid, or pieces of tissue come out of your vagina.  You have more pain or cramping.  You keep throwing up (vomiting).  You have pain when you pee (urinate) or have blood in your pee.  You have a fever.  You do not feel your baby moving as much.  You feel very weak or feel like passing out.  You have trouble breathing, with or without belly pain.  You have a very bad headache and belly pain.  You have fluid leaking from your vagina and belly pain.  You keep having watery poop (diarrhea).  Your belly pain does not go away after resting, or the pain gets worse. This information is not intended to replace advice given to you by your health care provider. Make sure you discuss any questions you have with your health care provider. Document Released: 10/25/2009 Document Revised: 06/14/2016 Document Reviewed: 06/05/2013 Elsevier Interactive Patient Education  2018 [employer].  Hyperemesis Gravidarum Hyperemesis gravidarum is a severe form of nausea and vomiting that happens during pregnancy. Hyperemesis is worse than morning sickness. It may cause you to have nausea or vomiting all day for many days. It may keep you from eating and drinking enough food and  liquids. Hyperemesis usually occurs during the first half (the first 20 weeks) of pregnancy. It often goes away once a woman is in her second half of pregnancy. However, sometimes hyperemesis continues through an entire pregnancy. What are the causes? The cause of this condition is not known. It may be related to changes in chemicals (hormones) in the body during pregnancy, such as the high level of pregnancy hormone (human chorionic gonadotropin) or the increase in the female sex hormone (estrogen). What are the signs or symptoms? Symptoms of this condition include:  Severe nausea and vomiting.  Nausea that does not go away.  Vomiting that does not allow you to keep any food down.  Weight loss.  Body fluid loss (dehydration).  Having no desire to eat, or not liking food that you have previously enjoyed.  How is this diagnosed? This condition may be diagnosed based on:  A physical exam.  Your medical history.  Your symptoms.  Blood tests.  Urine tests.  How is this treated? This condition may be managed with medicine. If medicines to do not help relieve nausea and vomiting, you may need to receive fluids through an IV tube at the hospital. Follow these instructions at home:  Take over-the-counter and prescription medicines only as told by your health care provider.  Avoid iron pills and multivitamins that contain iron for the first 3-4 months of pregnancy. If you take prescription iron pills, do not stop taking them unless your health care provider approves.  Take the following actions to help prevent nausea and vomiting: ? In the morning, before getting out of bed, try eating a couple of dry crackers or a piece of toast. ? Avoid foods and smells that upset your stomach. Fatty and spicy foods may make nausea worse. ? Eat 5-6 small meals a day. ? Do not drink fluids while eating meals. Drink between meals. ? Eat or suck on things that have ginger in them. Ginger can help  relieve nausea. ? Avoid food preparation. The smell of food can spoil your appetite or trigger nausea.  Follow instructions from your health care provider about eating or drinking restrictions.  For snacks, eat high-protein foods, such as cheese.  Keep all follow-up and pre-birth (prenatal) visits as told by your health care provider. This is important. Contact a health care provider if:  You have pain in your abdomen.  You have a severe headache.  You have vision problems.  You are losing weight. Get help right away if:  You cannot drink fluids without vomiting.  You vomit blood.  You have constant nausea and vomiting.  You are very weak.  You are very thirsty.  You feel dizzy.  You faint.  You have a fever or other symptoms that last for more than 2-3 days.  You have a fever and your symptoms suddenly get worse. Summary  Hyperemesis gravidarum is a severe form of nausea and vomiting that happens during pregnancy.  Making some changes to your eating habits may help relieve nausea and vomiting.  This condition may be managed with medicine.  If medicines to do not help relieve nausea and vomiting, you may need to receive fluids through an IV tube at the hospital. This information is not intended to replace advice given to you by your health care provider. Make sure you discuss any questions you have with your health care provider. Document Released: 11/06/2005 Document Revised: 07/05/2016 Document Reviewed: 07/05/2016 Elsevier Interactive Patient Education  2017 Else[employer] Inc.  Subchorionic Hematoma A subchorionic hematoma is a gathering of blood between the outer wall of the placenta and the inner wall of the womb (uterus). The placenta is the organ that connects the fetus to the wall of the uterus. The placenta performs the feeding, breathing (oxygen to the fetus), and waste removal (excretory work) of the fetus. Subchorionic hematoma is the most common abnormality  found on a result from ultrasonography done during the first trimester or early second trimester of pregnancy. If there has been little or no vaginal bleeding, early small hematomas usually shrink on their own and do not affect your baby or pregnancy. The blood is gradually absorbed over 1-2 weeks. When bleeding starts later in pregnancy or the hematoma is larger or occurs in an older pregnant woman, the outcome may not be as good. Larger hematomas may get bigger, which increases the chances for miscarriage. Subchorionic hematoma also increases the risk of premature detachment of the placenta from the uterus, preterm (premature) labor, and stillbirth. Follow these instructions at home:  Stay on bed rest if your health care provider recommends this. Although bed rest will not prevent more bleeding or prevent a miscarriage, your health care provider may recommend bed rest until you are advised otherwise.  Avoid heavy lifting (more than 10 lb [4.5 kg]), exercise, sexual intercourse, or douching as directed by your health care provider.  Keep track of the number of pads you use each day and how soaked (saturated) they are. Write down this information.  Do not use tampons.  Keep all follow-up appointments as directed by your health care provider. Your health care provider may ask you to have follow-up blood tests or ultrasound tests or both. Get help right away if:  You have severe cramps in your stomach, back, abdomen, or pelvis.  You have a fever.  You pass large clots or tissue. Save any tissue for your health care provider to look at.  Your bleeding increases or you become lightheaded, feel weak, or have fainting episodes. This information is not intended to replace advice given to you by your health care provider. Make sure you discuss any questions you have with your health care provider. Document Released: 02/21/2007 Document Revised: 04/13/2016 Document Reviewed: 06/05/2013 Elsevier  Interactive Patient Education  2017 Elsevier Inc.  Vaginal Bleeding During Pregnancy, First Trimester A small amount of bleeding (spotting) from the vagina is relatively common in early pregnancy. It usually stops on its own. Various things may cause bleeding or spotting in early pregnancy. Some bleeding may be related to the pregnancy, and some may not. In most cases, the bleeding is normal and is not a problem. However, bleeding can also be a sign of something serious. Be sure to tell your health care provider about any vaginal bleeding right away. Some possible causes of vaginal bleeding during the first trimester include:  Infection or inflammation of the cervix.  Growths (polyps) on the cervix.  Miscarriage or threatened miscarriage.  Pregnancy tissue has developed outside of the uterus and in a fallopian tube (tubal pregnancy).  Tiny cysts have developed in the uterus instead of pregnancy tissue (molar pregnancy).  Follow these instructions at home: Watch your condition for any changes. The following actions may help to lessen any discomfort you are feeling:  Follow your health care provider's instructions for limiting your activity. If your health care provider orders bed rest, you may need to stay in bed and only get up to use the bathroom. However, your health care provider may allow you to continue light activity.  If needed, make plans for someone to help with your regular activities and responsibilities while you are on bed rest.  Keep track of the number of pads you use each day, how often you change pads, and how soaked (saturated) they are. Write this down.  Do not use tampons. Do not douche.  Do not have sexual intercourse or orgasms until approved by your health care provider.  If you pass any tissue from your vagina, save the tissue so you can show it to your health care provider.  Only take over-the-counter or prescription medicines as directed by your health care  provider.  Do not take aspirin because it can make you bleed.  Keep all follow-up appointments as directed by your health care provider.  Contact a health care provider if:  You have any vaginal bleeding during any part of your pregnancy.  You have cramps or labor pains.  You have a fever, not controlled by medicine. Get help right away if:  You have severe cramps in your back or belly (abdomen).  You pass large clots or tissue from your vagina.  Your bleeding increases.  You feel light-headed or weak, or you have fainting episodes.  You have chills.  You are leaking fluid or have a gush of fluid from your vagina.  You pass out while having a bowel movement. This information is not intended to replace advice given to you by your health care provider. Make sure  you discuss any questions you have with your health care provider. Document Released: 08/16/2005 Document Revised: 04/13/2016 Document Reviewed: 07/14/2013 Elsevier Interactive Patient Education  [employer].

## 2018-01-21 NOTE — MAU Note (Signed)
Pt c/o pelvic pain starting last week with a + urine preg test at the [facility], VB starting yesterday like a period and today more spotting after voids. N/V since last week.

## 2018-01-22 LAB — GC/CHLAMYDIA PROBE AMP (~~LOC~~) NOT AT ARMC
Chlamydia: NEGATIVE
Neisseria Gonorrhea: NEGATIVE

## 2018-01-22 LAB — HIV ANTIBODY (ROUTINE TESTING W REFLEX): HIV Screen 4th Generation wRfx: NONREACTIVE

## 2018-02-06 ENCOUNTER — Encounter (HOSPITAL_COMMUNITY): Payer: Self-pay | Admitting: Family Medicine

## 2018-02-06 DIAGNOSIS — L739 Follicular disorder, unspecified: Secondary | ICD-10-CM | POA: Diagnosis not present

## 2018-02-06 DIAGNOSIS — L02412 Cutaneous abscess of left axilla: Secondary | ICD-10-CM | POA: Diagnosis present

## 2018-02-06 NOTE — ED Triage Notes (Signed)
Patient reports she has two abscess on right axillary, right elbow, and a bump on her left upper arm. She reports she has had these same abscess and reported they were MRSA without a culture taken. Patient reports she is currently [redacted] weeks pregnant and wants confirmation to know if she has MRSA. Denies fever and reports drainage from all the abscess.

## 2018-02-07 ENCOUNTER — Emergency Department (HOSPITAL_COMMUNITY)
Admission: EM | Admit: 2018-02-07 | Discharge: 2018-02-07 | Disposition: A | Discharge status: Home / Self Care | Payer: Medicaid Other | Attending: Emergency Medicine | Admitting: Emergency Medicine

## 2018-02-15 ENCOUNTER — Emergency Department (HOSPITAL_COMMUNITY)
Admission: EM | Admit: 2018-02-15 | Discharge: 2018-02-16 | Discharge status: Against Medical Advice | Payer: Medicaid Other

## 2018-02-15 NOTE — ED Notes (Signed)
Called patient to go to a room and no answer 

## 2018-02-15 NOTE — ED Notes (Signed)
Called patient and no answer.

## 2018-02-16 NOTE — ED Notes (Signed)
Called patient and no answer.

## 2018-04-17 ENCOUNTER — Inpatient Hospital Stay (HOSPITAL_COMMUNITY)
Admission: AD | Admit: 2018-04-17 | Discharge: 2018-04-17 | Disposition: A | Discharge status: Home / Self Care | Payer: Medicaid Other | Source: Ambulatory Visit | Attending: Obstetrics and Gynecology | Admitting: Obstetrics and Gynecology

## 2018-04-17 ENCOUNTER — Inpatient Hospital Stay (HOSPITAL_COMMUNITY): Payer: Medicaid Other

## 2018-04-17 ENCOUNTER — Encounter (HOSPITAL_COMMUNITY): Payer: Self-pay

## 2018-04-17 DIAGNOSIS — R109 Unspecified abdominal pain: Secondary | ICD-10-CM | POA: Diagnosis not present

## 2018-04-17 DIAGNOSIS — Z91013 Allergy to seafood: Secondary | ICD-10-CM | POA: Insufficient documentation

## 2018-04-17 DIAGNOSIS — R51 Headache: Secondary | ICD-10-CM | POA: Insufficient documentation

## 2018-04-17 DIAGNOSIS — O039 Complete or unspecified spontaneous abortion without complication: Secondary | ICD-10-CM | POA: Insufficient documentation

## 2018-04-17 DIAGNOSIS — N939 Abnormal uterine and vaginal bleeding, unspecified: Secondary | ICD-10-CM | POA: Diagnosis not present

## 2018-04-17 DIAGNOSIS — Z79899 Other long term (current) drug therapy: Secondary | ICD-10-CM | POA: Diagnosis not present

## 2018-04-17 DIAGNOSIS — Z3A19 19 weeks gestation of pregnancy: Secondary | ICD-10-CM | POA: Diagnosis present

## 2018-04-17 DIAGNOSIS — Z6790 Unspecified blood type, Rh positive: Secondary | ICD-10-CM

## 2018-04-17 DIAGNOSIS — O26892 Other specified pregnancy related conditions, second trimester: Secondary | ICD-10-CM | POA: Diagnosis present

## 2018-04-17 DIAGNOSIS — R110 Nausea: Secondary | ICD-10-CM | POA: Diagnosis not present

## 2018-04-17 DIAGNOSIS — Z91018 Allergy to other foods: Secondary | ICD-10-CM | POA: Insufficient documentation

## 2018-04-17 DIAGNOSIS — O3680X0 Pregnancy with inconclusive fetal viability, not applicable or unspecified: Secondary | ICD-10-CM

## 2018-04-17 HISTORY — DX: Anemia, unspecified: D64.9

## 2018-04-17 LAB — URINALYSIS, ROUTINE W REFLEX MICROSCOPIC
BILIRUBIN URINE: NEGATIVE
Bacteria, UA: NONE SEEN
Glucose, UA: NEGATIVE mg/dL
KETONES UR: NEGATIVE mg/dL
LEUKOCYTES UA: NEGATIVE
Nitrite: NEGATIVE
Protein, ur: NEGATIVE mg/dL
SPECIFIC GRAVITY, URINE: 1.02 (ref 1.005–1.030)
pH: 6 (ref 5.0–8.0)

## 2018-04-17 LAB — CBC
HCT: 35.2 % — ABNORMAL LOW (ref 36.0–46.0)
HEMOGLOBIN: 11.7 g/dL — AB (ref 12.0–15.0)
MCH: 30.2 pg (ref 26.0–34.0)
MCHC: 33.2 g/dL (ref 30.0–36.0)
MCV: 90.7 fL (ref 78.0–100.0)
Platelets: 205 10*3/uL (ref 150–400)
RBC: 3.88 MIL/uL (ref 3.87–5.11)
RDW: 12 % (ref 11.5–15.5)
WBC: 5.6 10*3/uL (ref 4.0–10.5)

## 2018-04-17 LAB — WET PREP, GENITAL
Clue Cells Wet Prep HPF POC: NONE SEEN
SPERM: NONE SEEN
Trich, Wet Prep: NONE SEEN
YEAST WET PREP: NONE SEEN

## 2018-04-17 LAB — HCG, QUANTITATIVE, PREGNANCY: hCG, Beta Chain, Quant, S: 10 m[IU]/mL — ABNORMAL HIGH (ref <5)

## 2018-04-17 NOTE — MAU Provider Note (Signed)
History     CSN: [id number]  Arrival date and time: 04/17/18 4098   First Provider Initiated Contact with Patient 04/17/18 1023      Chief Complaint  Patient presents with  . Vaginal Bleeding   G2P1001 .1 wks here with VB, passing tissue, and pelvic pain. Bleeding started 2 weeks ago and has been intermittent. Last week it was the heaviest and she passed some tissue, was using 2 pads a day. Not bleeding now. Associated sx are sharp pelvic pain, lasting a few seconds, and resolves without intervention. She reports increased stress as her little brother passed recently. She has not started [facility] yet. Of note she had an [place] in March that showed viable IUP at 6 wks with small [facility].    OB History    Gravida  2   Para  1   Term  1   Preterm      AB      Living  1     SAB      TAB      Ectopic      Multiple      Live Births  1           Past Medical History:  Diagnosis Date  . Anemia   . Anorectal fissure   . Blood transfusion   . Cervicitis   . Eczema   . H/O bladder infections   . Hx of blood clots   . Post partum depression     Past Surgical History:  Procedure Laterality Date  . WISDOM TOOTH EXTRACTION      Family History  Problem Relation Age of Onset  . Migraines Mother   . Gallbladder disease Mother   . Nephrolithiasis Mother   . Asthma Mother   . Cancer Maternal Grandfather   . Hernia Brother   . Asthma Daughter   . Cancer Maternal Aunt     Social History   Tobacco Use  . Smoking status: Never Smoker  . Smokeless tobacco: Never Used  Substance Use Topics  . Alcohol use: No    Frequency: Never  . Drug use: Yes    Types: Marijuana    Comment: Denies since pregnant     Allergies:  Allergies  Allergen Reactions  . Fish Allergy Hives  . Strawberry Extract Hives and Itching    Medications Prior to Admission  Medication Sig Dispense Refill Last Dose  . penicillin v potassium (VEETID) 500 MG tablet Take 1 tablet (500 mg total)  by mouth 4 (four) times daily. (Patient not taking: Reported on 02/07/2018) 40 tablet 0 Not Taking at Unknown time  . Prenatal Vit-Fe Fumarate-FA (PRENATAL MULTIVITAMIN) TABS tablet Take 1 tablet by mouth daily at 12 noon.   02/06/2018 at Unknown time  . promethazine (PHENERGAN) 12.5 MG tablet Take 1 tablet (12.5 mg total) by mouth every 6 (six) hours as needed for nausea or vomiting. (Patient not taking: Reported on 02/07/2018) 30 tablet 0 Not Taking at Unknown time    Review of Systems  Constitutional: Negative for fever.  Gastrointestinal: Negative for constipation and diarrhea.  Genitourinary: Positive for pelvic pain and vaginal bleeding. Negative for dyspareunia and dysuria.   Physical Exam   Temperature 98.5 F (36.9 C), temperature source Oral, resp. rate 16, weight 155 lb (70.3 kg), last menstrual period 12/04/2017.  Physical Exam  Constitutional: She is oriented to person, place, and time. She appears well-developed and well-nourished. No distress.  HENT:  Head: Normocephalic and atraumatic.  Neck: Normal range of motion.  Respiratory: Effort normal. No respiratory distress.  GI: Soft. She exhibits no distension and no mass. There is no tenderness. There is no rebound and no guarding.  Genitourinary:  Genitourinary Comments: External: no lesions or erythema Vagina: rugated, pink, moist, scant white discharge, no blood Uterus: non-enlarged, anteverted, non tender, no CMT Adnexae: no masses, no tenderness left, no tenderness right Cervix closed   Musculoskeletal: Normal range of motion.  Neurological: She is alert and oriented to person, place, and time.  Skin: Skin is warm and dry.  Psychiatric: She has a normal mood and affect.   Results for orders placed or performed during the hospital encounter of 04/17/18 (from the past 24 hour(s))  Urinalysis, Routine w reflex microscopic     Status: Abnormal   Collection Time: 04/17/18  9:57 AM  Result Value Ref Range   Color, Urine  YELLOW YELLOW   APPearance CLEAR CLEAR   Specific Gravity, Urine 1.020 1.005 - 1.030   pH 6.0 5.0 - 8.0   Glucose, UA NEGATIVE NEGATIVE mg/dL   Hgb urine dipstick SMALL (A) NEGATIVE   Bilirubin Urine NEGATIVE NEGATIVE   Ketones, ur NEGATIVE NEGATIVE mg/dL   Protein, ur NEGATIVE NEGATIVE mg/dL   Nitrite NEGATIVE NEGATIVE   Leukocytes, UA NEGATIVE NEGATIVE   RBC / HPF 0-5 0 - 5 RBC/hpf   WBC, UA 0-5 0 - 5 WBC/hpf   Bacteria, UA NONE SEEN NONE SEEN   Squamous Epithelial / LPF 6-10 0 - 5   Mucus PRESENT    Hyaline Casts, UA PRESENT   Wet prep, genital     Status: Abnormal   Collection Time: 04/17/18 10:33 AM  Result Value Ref Range   Yeast Wet Prep HPF POC NONE SEEN NONE SEEN   Trich, Wet Prep NONE SEEN NONE SEEN   Clue Cells Wet Prep HPF POC NONE SEEN NONE SEEN   WBC, Wet Prep HPF POC FEW (A) NONE SEEN   Sperm NONE SEEN   CBC     Status: Abnormal   Collection Time: 04/17/18 10:51 AM  Result Value Ref Range   WBC 5.6 4.0 - 10.5 K/uL   RBC 3.88 3.87 - 5.11 MIL/uL   Hemoglobin 11.7 (L) 12.0 - 15.0 g/dL   HCT 16.1 (L) 09.6 - 04.5 %   MCV 90.7 78.0 - 100.0 fL   MCH 30.2 26.0 - 34.0 pg   MCHC 33.2 30.0 - 36.0 g/dL   RDW 40.9 81.1 - 91.4 %   Platelets 205 150 - 400 K/uL  hCG, quantitative, pregnancy     Status: Abnormal   Collection Time: 04/17/18 10:51 AM  Result Value Ref Range   hCG, Beta Chain, Quant, S 10 (H) <5 mIU/mL   US Ob Transvaginal  Result Date: 04/17/2018 CLINICAL DATA:  Follow-up viability EXAM: TRANSVAGINAL OB ULTRASOUND TECHNIQUE: Transvaginal ultrasound was performed for complete evaluation of the gestation as well as the maternal uterus, adnexal regions, and pelvic cul-de-sac. COMPARISON:  01/21/2018 FINDINGS: Intrauterine gestational sac: None Yolk sac:  Not visualized Embryo:  Not visualized Cardiac Activity: Not visualized Heart Rate:  bpm MSD:   mm    w     d CRL:     mm    w  d                  [place] EDC: Subchorionic hemorrhage:  None visualized. Maternal  uterus/adnexae: 5.0 x 4.2 x 2.5 cm simple appearing left ovarian cyst. Trace free  fluid. IMPRESSION: No intrauterine pregnancy visualized. Differential considerations would include early intrauterine pregnancy too early to visualize, spontaneous abortion, or occult ectopic pregnancy. Recommend close clinical followup and serial quantitative beta HCGs and ultrasounds. Electronically Signed   By: Charlett Nose M.D.   On: 04/17/2018 11:56   MAU Course  Procedures  MDM Labs and [place] ordered and reviewed. [place] and low quant consistent with SAB. Discussed findings with pt, support offered. Stable for discharge home.  Assessment and Plan  SAB Rh pos Discharge home Follow up in [facility] in 1 week for quant HCG and 2 weeks for provider visit Return precautions  Allergies as of 04/17/2018      Reactions   Fish Allergy Hives   Strawberry Extract Hives, Itching      Medication List    STOP taking these medications   penicillin v potassium 500 MG tablet Commonly known as:  VEETID   promethazine 12.5 MG tablet Commonly known as:  PHENERGAN     TAKE these medications   prenatal multivitamin Tabs tablet Take 1 tablet by mouth daily at 12 noon.      Donette Larry, CNM 04/17/2018, 12:08 PM

## 2018-04-17 NOTE — Discharge Instructions (Signed)

## 2018-04-17 NOTE — MAU Note (Signed)
Pt has bleeding on and off, none today, for about 2 weeks. Is seeing some type of tissue or clots recently. Only has pain when she's bleeding.

## 2018-04-17 NOTE — MAU Provider Note (Addendum)
Chief Complaint: Vaginal Bleeding   SUBJECTIVE HPI: Stacey Mann is a 27 y.o. G2P1001 at [redacted]w[redacted]d who presents to MAU intermittent vaginal bleeding that that began 2 weeks ago.  She does not have vaginal bleeding today, but her worst episode was last week.  During that episode she noticed increased heavy bleeding and passing of some type of tissue, and used approximately 2 pads in a day.  It was also associated with sharp pelvic pain that lasted for a couple seconds that relieves on its own and felt like she was on her period.  She mentions that her little brother just passed away recently and this has added to her stress level.  She denies any complications during previous pregnancy and current pregnancy.  Per record review, patient has not initiated prenatal care.  She endorses HA, nausea, fatigue, regular BM  She denies fever, dizziness, vomiting, dysuria, diarrhea, swelling of extremities  Past Medical History:  Diagnosis Date  . Anemia   . Anorectal fissure   . Blood transfusion   . Cervicitis   . Eczema   . H/O bladder infections   . Hx of blood clots   . Post partum depression    OB History  Gravida Para Term Preterm AB Living  SAB TAB Ectopic Multiple Live Births          1    # Outcome Date GA Lbr Len/2nd Weight Sex Delivery Anes PTL Lv  2 Current           1 Term      Vag-Spont   LIV   Past Surgical History:  Procedure Laterality Date  . WISDOM TOOTH EXTRACTION     Social History   Socioeconomic History  . Marital status: Single    Spouse name: Not on file  . Number of children: 1  . Years of education: college  . Highest education level: Not on file  Occupational History  . Occupation: Advertising account executive  . Financial resource strain: Not on file  . Food insecurity:    Worry: Not on file    Inability: Not on file  . Transportation needs:    Medical: Not on file    Non-medical: Not on file  Tobacco Use  . Smoking status: Never  Smoker  . Smokeless tobacco: Never Used  Substance and Sexual Activity  . Alcohol use: No    Frequency: Never  . Drug use: Yes    Types: Marijuana    Comment: Denies since pregnant   . Sexual activity: Yes    Birth control/protection: None  Lifestyle  . Physical activity:    Days per week: Not on file    Minutes per session: Not on file  . Stress: Not on file  Relationships  . Social connections:    Talks on phone: Not on file    Gets together: Not on file    Attends religious service: Not on file    Active member of club or organization: Not on file    Attends meetings of clubs or organizations: Not on file    Relationship status: Not on file  . Intimate partner violence:    Fear of current or ex partner: Not on file    Emotionally abused: Not on file    Physically abused: Not on file    Forced sexual activity: Not on file  Other Topics Concern  . Not on file  Social History Narrative   Drinks caffeine daily "a lot"   No current facility-administered medications on file prior to encounter.    Current Outpatient Medications on File Prior to Encounter  Medication Sig Dispense Refill  . penicillin v potassium (VEETID) 500 MG tablet Take 1 tablet (500 mg total) by mouth 4 (four) times daily. (Patient not taking: Reported on 02/07/2018) 40 tablet 0  . Prenatal Vit-Fe Fumarate-FA (PRENATAL MULTIVITAMIN) TABS tablet Take 1 tablet by mouth daily at 12 noon.    . promethazine (PHENERGAN) 12.5 MG tablet Take 1 tablet (12.5 mg total) by mouth every 6 (six) hours as needed for nausea or vomiting. (Patient not taking: Reported on 02/07/2018) 30 tablet 0   Allergies  Allergen Reactions  . Fish Allergy Hives  . Strawberry Extract Hives and Itching    I have reviewed the past Medical Hx, Surgical Hx, Social Hx, Allergies and Medications.   REVIEW OF SYSTEMS   General ROS: positive for  - fatigue negative for - fever ENT ROS: positive for - headaches Respiratory ROS: no cough,  shortness of breath, or wheezing Cardiovascular ROS: no chest pain or dyspnea on exertion Gastrointestinal ROS: no abdominal pain, change in bowel habits, or black or bloody stools Genito-Urinary ROS: no dysuria, trouble voiding, or hematuria positive for - pelvic pain and vaginal bleeding Musculoskeletal ROS: negative Neurological ROS: no TIA or stroke symptoms Dermatological ROS: negative   OBJECTIVE Temp 98.5 F (36.9 C) (Oral)   Resp 16   Wt 70.3 kg (155 lb)   LMP 12/04/2017   BMI 22.89 kg/m    PHYSICAL EXAM Constitutional: Well-developed, well-nourished female in no acute distress.  Cardiovascular: normal rate and rhythm, pulses intact Respiratory: normal rate and effort.  GI: Abd soft, non-tender, non-distended. Normoactive BS, uterus not palpable at level of umbilicus MS: Extremities nontender, no edema, normal ROM Neurologic: Alert and oriented x 4. No focal deficits GU: Neg CVAT. SPECULUM EXAM: Deferred.  Performed by CNM. BIMANUAL: Deferred.  Performed by CNM  Psych: normal mood and affect  LAB RESULTS No results found for this or any previous visit (from the past 24 hour(s)).  IMAGING No results found.  MAU Management/MDM: Vitals and nursing notes reviewed No orders of the defined types were placed in this encounter.   No orders of the defined types were placed in this encounter.  Stacey Mann is a 27 y.o. G2P1001 at [redacted]w[redacted]d who presents to MAU intermittent vaginal bleeding and passage of tissue with associated cramping for the past 2 weeks.  Patient likely has a spontaneous abortion given small uterus for gestational age, vaginal bleeding, FHR undetectable, and ultrasound at 6 weeks notable for small subchorionic hematoma.  Will continue to assess for other infectious etiologies for vaginal bleeding and pain including STIs and UTI.  Pain can also be explained by round ligament pain.  Ultrasound will be able to evaluate for placental abruption or placenta  previa in addition to spontaneous abortion.  Patient is in currently stable, but will obtain CBC to check for anemia.  -CBC -UA  -Wet prep  -GC -OB Transvaginal Ultrasound -Quant hcG  Plan of care reviewed with patient, including labs and tests ordered and medical treatment.  Treatments in MAU included none.   Stacey Mann is a 27 y.o. G2P1001 at [redacted]w[redacted]d who presents to MAU intermittent vaginal bleeding and passage of tissue with associated cramping for the past 2 weeks has a spontaneous abortion confirmed by ultrasound and quantitative bhcg level of 10,  decreased from 57,089 two months ago.  ASSESSMENT SAB  PLAN Discharge home in stable condition. Patient to return to [facility] in 1 week for follow-up appointment. Recommend Tylenol/Ibuprofen as needed for pain or cramping. Continue to take prenatal vitamins. Counseled on return precautions  Allergies as of 04/17/2018      Reactions   Fish Allergy Hives   Strawberry Extract Hives, Itching      Medication List    STOP taking these medications   penicillin v potassium 500 MG tablet Commonly known as:  VEETID   promethazine 12.5 MG tablet Commonly known as:  PHENERGAN     TAKE these medications   prenatal multivitamin Tabs tablet Take 1 tablet by mouth daily at 12 noon.        Christel Mormon, Medical Student 04/17/2018, 9:50 AM

## 2018-04-18 LAB — GC/CHLAMYDIA PROBE AMP (~~LOC~~) NOT AT ARMC
CHLAMYDIA, DNA PROBE: NEGATIVE
Neisseria Gonorrhea: NEGATIVE

## 2018-04-19 ENCOUNTER — Telehealth: Payer: Self-pay | Admitting: General Practice

## 2018-04-19 NOTE — Telephone Encounter (Signed)
Patient called and notified of appointments on 04/24/18 and 05/01/18.  Patient verbalized understanding.

## 2018-04-23 ENCOUNTER — Other Ambulatory Visit: Payer: Self-pay | Admitting: General Practice

## 2018-04-23 DIAGNOSIS — O039 Complete or unspecified spontaneous abortion without complication: Secondary | ICD-10-CM

## 2018-04-24 ENCOUNTER — Other Ambulatory Visit: Payer: Medicaid Other

## 2018-05-01 ENCOUNTER — Encounter (INDEPENDENT_AMBULATORY_CARE_PROVIDER_SITE_OTHER): Payer: Self-pay

## 2018-05-01 ENCOUNTER — Ambulatory Visit: Payer: Medicaid Other | Admitting: Nurse Practitioner

## 2018-12-03 ENCOUNTER — Encounter (HOSPITAL_COMMUNITY): Payer: Self-pay

## 2018-12-18 IMAGING — US US OB TRANSVAGINAL
1 series · 15 of 28 positions shown · non-contrast
Comparison: 01/21/2018

CLINICAL DATA: Follow-up viability

EXAM:
TRANSVAGINAL OB ULTRASOUND
TECHNIQUE: Transvaginal ultrasound was performed for complete evaluation of the
gestation as well as the maternal uterus, adnexal regions, and
pelvic cul-de-sac.

[Series 1: us ob transvaginal · 15 of 36 slices shown]
[im 1/36]
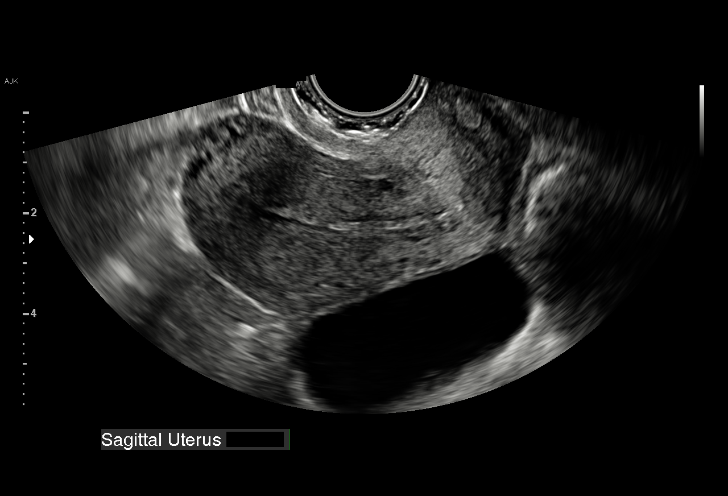
[im 3/36]
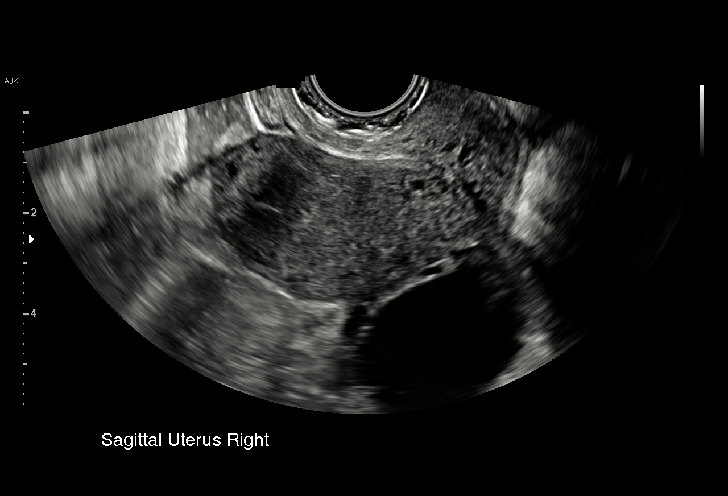
[im 6/36]
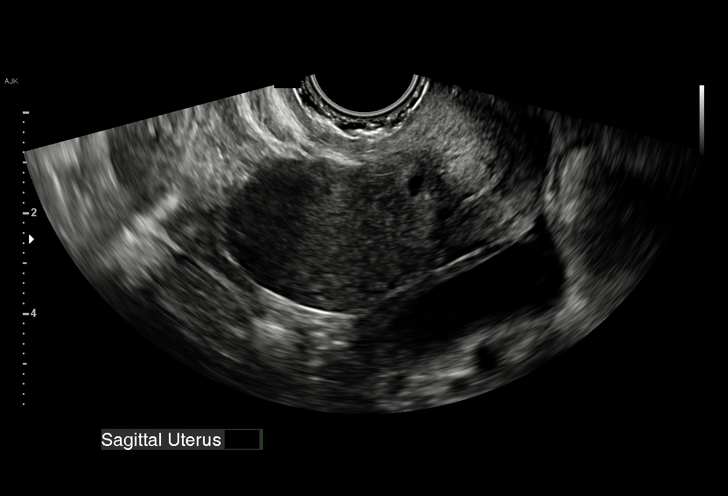
[im 8/36]
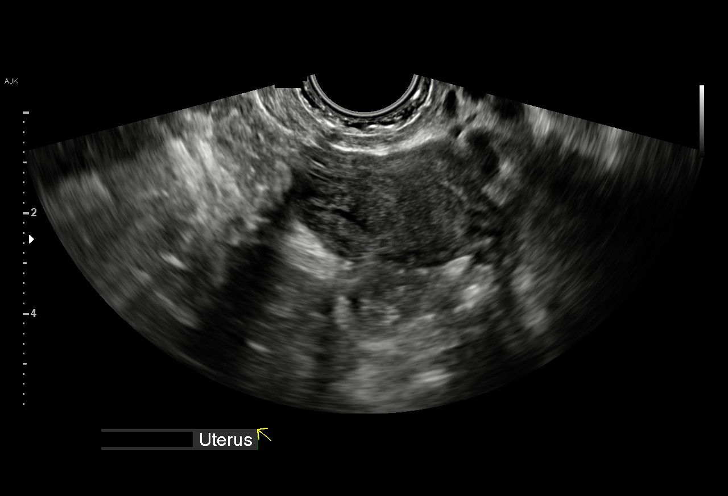
[im 11/36]
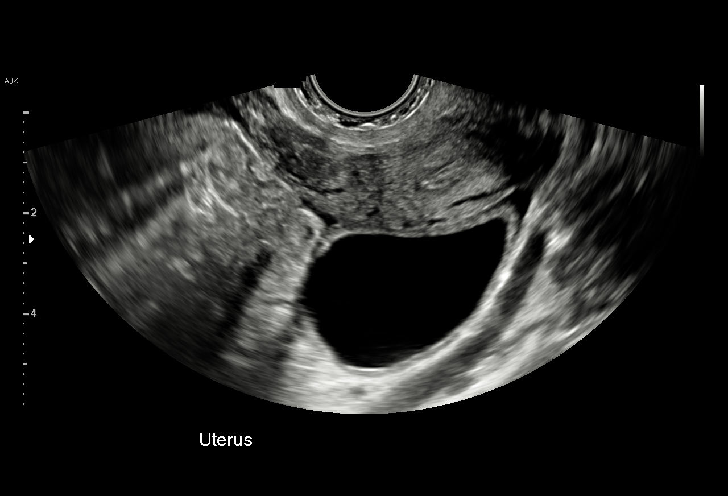
[im 13/36]
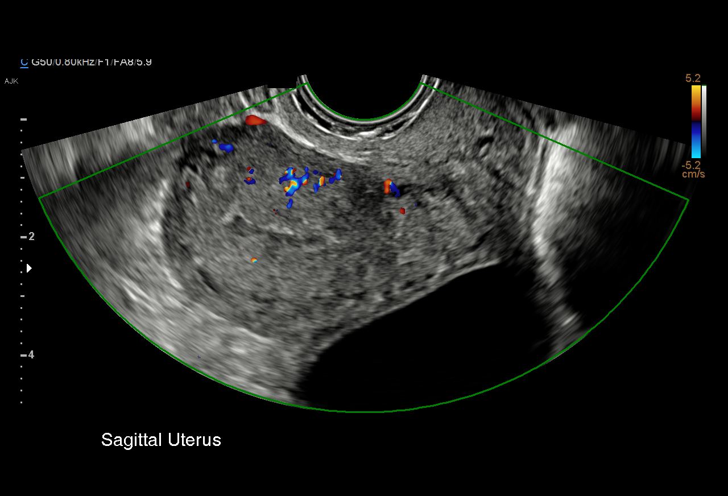
[im 16/36]
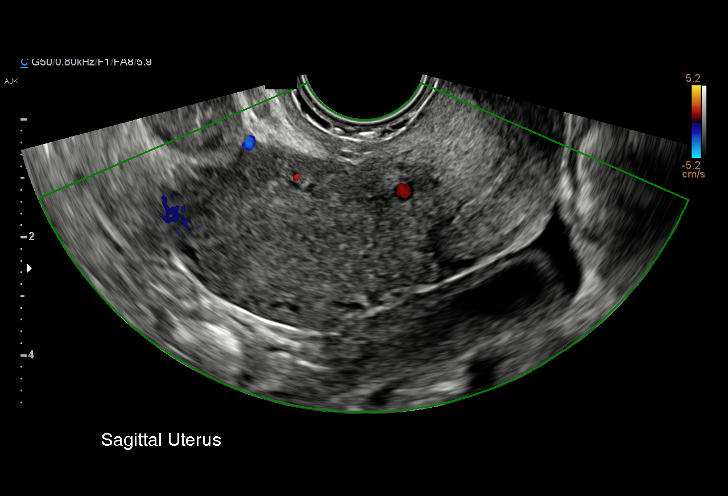
[im 19/36]
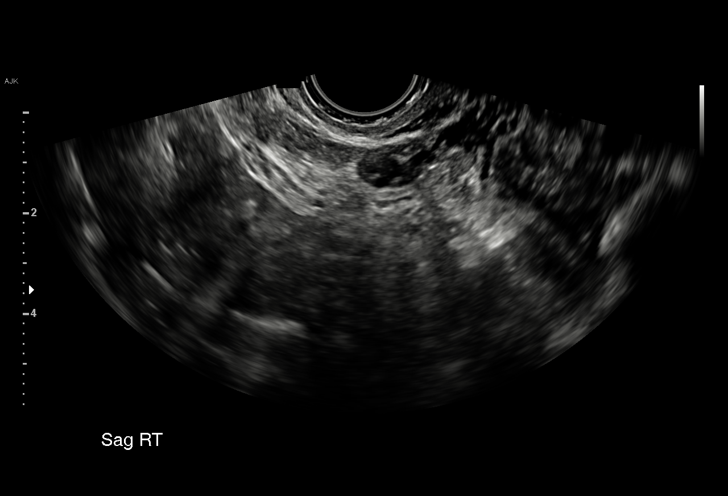
[im 20/36]
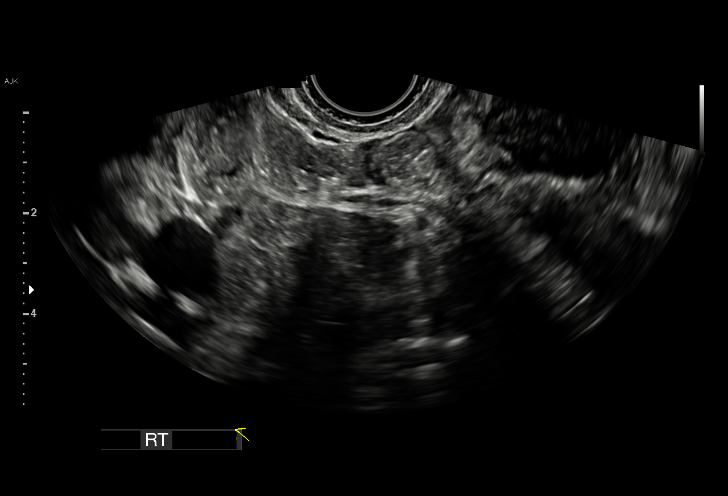
[im 23/36]
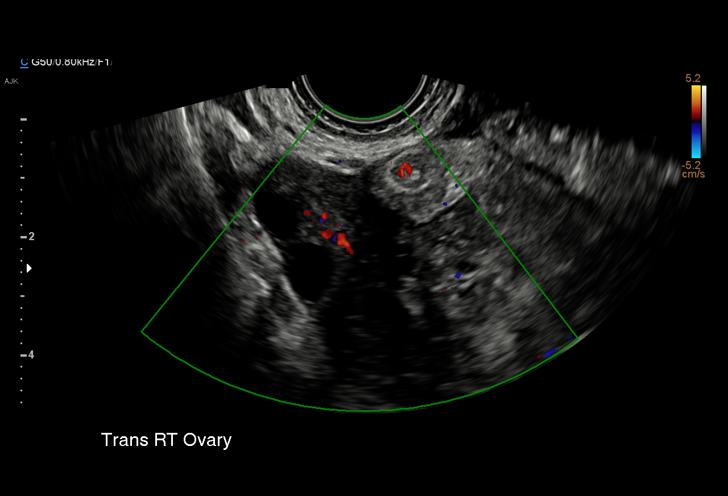
[im 25/36]
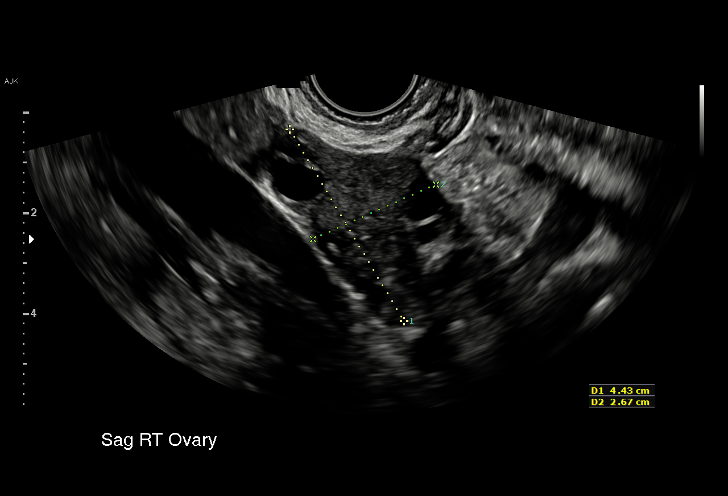
[im 28/36]
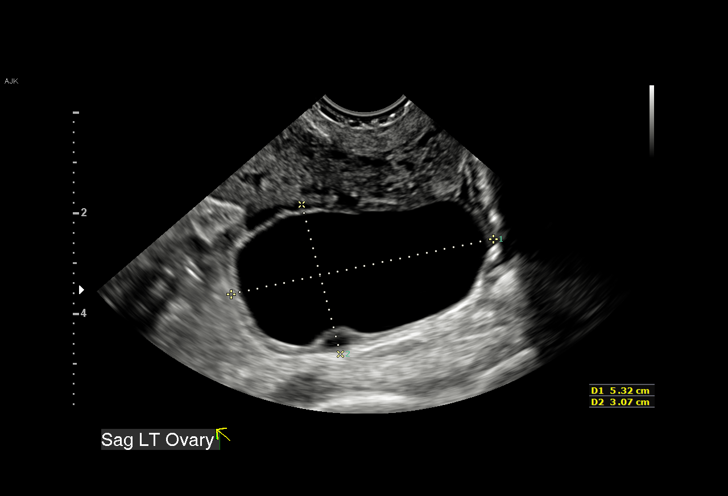
[im 30/36]
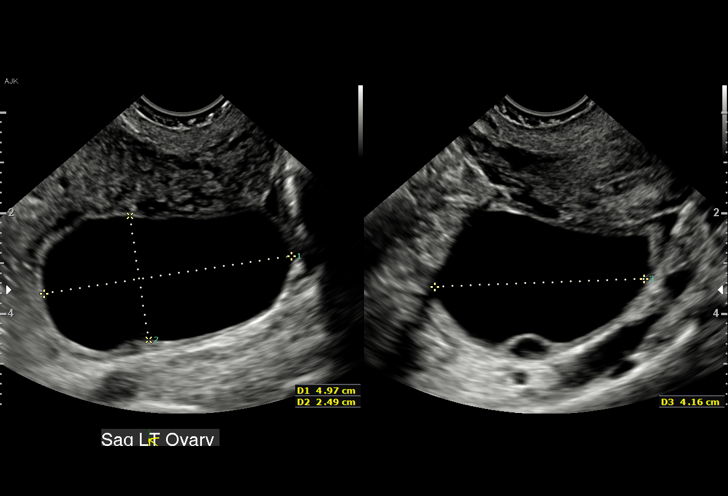
[im 33/36]
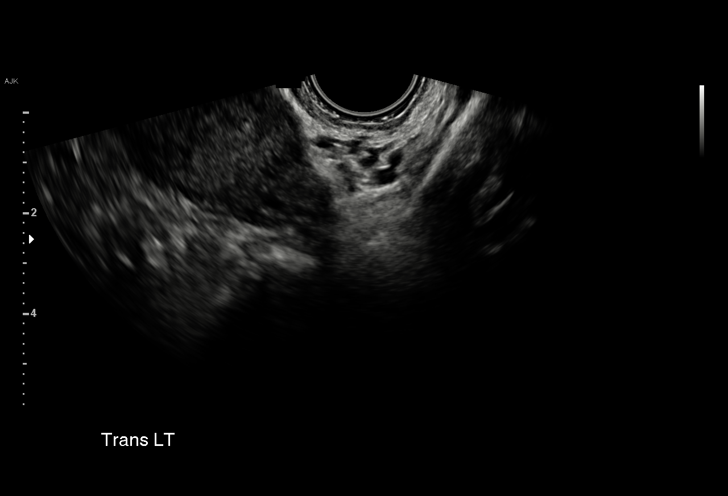
[im 36/36]
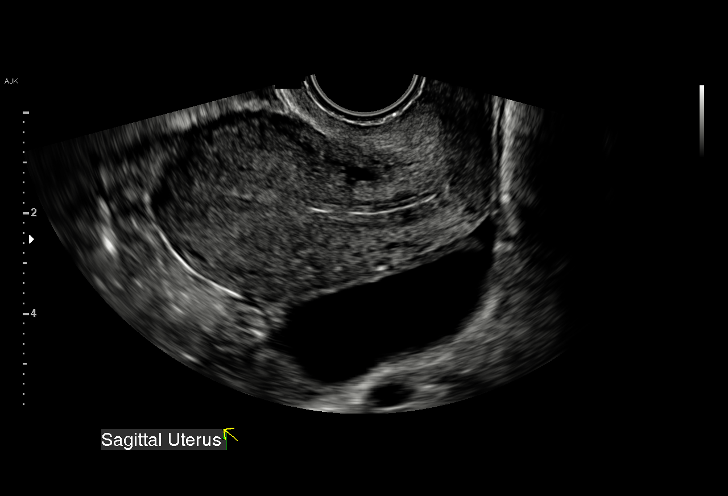

[15 of 28 positions shown; findings below may reference images not displayed]

FINDINGS: Intrauterine gestational sac: None

Yolk sac:  Not visualized

Embryo:  Not visualized

Cardiac Activity: Not visualized

Heart Rate:  bpm

MSD:   mm    w     d

CRL:     mm    w  d                  US EDC:

Subchorionic hemorrhage:  None visualized.

Maternal uterus/adnexae: 5.0 x 4.2 x 2.5 cm simple appearing left
ovarian cyst. Trace free fluid.
IMPRESSION: No intrauterine pregnancy visualized. Differential considerations
would include early intrauterine pregnancy too early to visualize,
spontaneous abortion, or occult ectopic pregnancy. Recommend close
clinical followup and serial quantitative beta HCGs and ultrasounds.
